# Patient Record
Sex: Female | Born: 1960 | Race: Black or African American | Hispanic: No | Smoking: Never smoker
Health system: Southern US, Community
[De-identification: ages and names within clinical notes are randomized; demographics above are authoritative.]

## PROBLEM LIST (undated history)

## (undated) ENCOUNTER — Emergency Department: Discharge status: Absent without leave | Payer: Medicare HMO

## (undated) DIAGNOSIS — I10 Essential (primary) hypertension: Secondary | ICD-10-CM

## (undated) DIAGNOSIS — F22 Delusional disorders: Secondary | ICD-10-CM

---

## 2004-04-26 ENCOUNTER — Ambulatory Visit: Payer: Self-pay | Admitting: Specialist

## 2005-08-04 ENCOUNTER — Ambulatory Visit: Payer: Self-pay | Admitting: Family Medicine

## 2006-04-19 ENCOUNTER — Ambulatory Visit: Payer: Self-pay | Admitting: Family Medicine

## 2006-05-10 ENCOUNTER — Ambulatory Visit: Payer: Self-pay | Admitting: Family Medicine

## 2006-08-07 ENCOUNTER — Ambulatory Visit: Payer: Self-pay | Admitting: Family Medicine

## 2007-01-22 ENCOUNTER — Emergency Department: Payer: Self-pay | Admitting: Emergency Medicine

## 2008-01-02 ENCOUNTER — Ambulatory Visit: Payer: Self-pay | Admitting: Family Medicine

## 2008-11-01 ENCOUNTER — Emergency Department: Payer: Self-pay | Admitting: Emergency Medicine

## 2009-01-07 ENCOUNTER — Ambulatory Visit: Payer: Self-pay | Admitting: Family Medicine

## 2009-05-19 ENCOUNTER — Emergency Department: Payer: Self-pay | Admitting: Emergency Medicine

## 2010-01-21 ENCOUNTER — Ambulatory Visit: Payer: Self-pay | Admitting: Family Medicine

## 2012-03-29 ENCOUNTER — Ambulatory Visit: Payer: Self-pay | Admitting: Family Medicine

## 2013-04-08 ENCOUNTER — Ambulatory Visit: Payer: Self-pay | Admitting: Family Medicine

## 2013-10-23 ENCOUNTER — Ambulatory Visit: Payer: Self-pay | Admitting: Family Medicine

## 2013-12-17 ENCOUNTER — Emergency Department: Payer: Self-pay | Admitting: Emergency Medicine

## 2014-03-14 ENCOUNTER — Ambulatory Visit: Payer: Self-pay | Admitting: Family Medicine

## 2014-03-31 DIAGNOSIS — M62838 Other muscle spasm: Secondary | ICD-10-CM | POA: Insufficient documentation

## 2014-06-24 ENCOUNTER — Ambulatory Visit: Payer: Self-pay | Admitting: Physical Medicine and Rehabilitation

## 2014-06-24 DIAGNOSIS — M47816 Spondylosis without myelopathy or radiculopathy, lumbar region: Secondary | ICD-10-CM | POA: Diagnosis not present

## 2014-07-03 DIAGNOSIS — M62838 Other muscle spasm: Secondary | ICD-10-CM | POA: Diagnosis not present

## 2014-07-03 DIAGNOSIS — M62830 Muscle spasm of back: Secondary | ICD-10-CM | POA: Diagnosis not present

## 2014-07-03 DIAGNOSIS — M5416 Radiculopathy, lumbar region: Secondary | ICD-10-CM | POA: Diagnosis not present

## 2014-07-31 DIAGNOSIS — I10 Essential (primary) hypertension: Secondary | ICD-10-CM | POA: Diagnosis not present

## 2014-07-31 DIAGNOSIS — J069 Acute upper respiratory infection, unspecified: Secondary | ICD-10-CM | POA: Diagnosis not present

## 2014-07-31 DIAGNOSIS — M791 Myalgia: Secondary | ICD-10-CM | POA: Diagnosis not present

## 2014-07-31 DIAGNOSIS — F411 Generalized anxiety disorder: Secondary | ICD-10-CM | POA: Diagnosis not present

## 2014-08-04 DIAGNOSIS — F29 Unspecified psychosis not due to a substance or known physiological condition: Secondary | ICD-10-CM | POA: Diagnosis not present

## 2015-05-28 ENCOUNTER — Other Ambulatory Visit: Payer: Self-pay | Admitting: Family Medicine

## 2015-05-28 DIAGNOSIS — R1013 Epigastric pain: Secondary | ICD-10-CM

## 2015-05-28 DIAGNOSIS — R110 Nausea: Secondary | ICD-10-CM

## 2015-05-29 ENCOUNTER — Ambulatory Visit
Admission: RE | Admit: 2015-05-29 | Discharge: 2015-05-29 | Disposition: A | Discharge status: Home / Self Care | Payer: Medicare HMO | Source: Ambulatory Visit | Attending: Family Medicine | Admitting: Family Medicine

## 2015-05-29 DIAGNOSIS — K8020 Calculus of gallbladder without cholecystitis without obstruction: Secondary | ICD-10-CM | POA: Insufficient documentation

## 2015-05-29 DIAGNOSIS — R110 Nausea: Secondary | ICD-10-CM

## 2015-05-29 DIAGNOSIS — R1013 Epigastric pain: Secondary | ICD-10-CM | POA: Diagnosis present

## 2016-09-23 ENCOUNTER — Emergency Department
Admission: EM | Admit: 2016-09-23 | Discharge: 2016-09-23 | Disposition: A | Discharge status: Home / Self Care | Payer: Medicare HMO | Attending: Emergency Medicine | Admitting: Emergency Medicine

## 2016-09-23 ENCOUNTER — Emergency Department: Payer: Medicare HMO

## 2016-09-23 ENCOUNTER — Encounter: Payer: Self-pay | Admitting: Emergency Medicine

## 2016-09-23 DIAGNOSIS — I10 Essential (primary) hypertension: Secondary | ICD-10-CM | POA: Diagnosis not present

## 2016-09-23 DIAGNOSIS — R4182 Altered mental status, unspecified: Secondary | ICD-10-CM | POA: Diagnosis not present

## 2016-09-23 HISTORY — DX: Essential (primary) hypertension: I10

## 2016-09-23 LAB — CBC WITH DIFFERENTIAL/PLATELET
BASOS ABS: 0 10*3/uL (ref 0–0.1)
BASOS PCT: 0 %
EOS PCT: 0 %
Eosinophils Absolute: 0 10*3/uL (ref 0–0.7)
HCT: 38.9 % (ref 35.0–47.0)
Hemoglobin: 13.1 g/dL (ref 12.0–16.0)
Lymphocytes Relative: 21 %
Lymphs Abs: 1.1 10*3/uL (ref 1.0–3.6)
MCH: 28.9 pg (ref 26.0–34.0)
MCHC: 33.6 g/dL (ref 32.0–36.0)
MCV: 86.1 fL (ref 80.0–100.0)
MONO ABS: 0.5 10*3/uL (ref 0.2–0.9)
Monocytes Relative: 10 %
Neutro Abs: 3.5 10*3/uL (ref 1.4–6.5)
Neutrophils Relative %: 69 %
PLATELETS: 261 10*3/uL (ref 150–440)
RBC: 4.52 MIL/uL (ref 3.80–5.20)
RDW: 14.5 % (ref 11.5–14.5)
WBC: 5 10*3/uL (ref 3.6–11.0)

## 2016-09-23 LAB — BASIC METABOLIC PANEL
Anion gap: 5 (ref 5–15)
BUN: 10 mg/dL (ref 6–20)
CHLORIDE: 108 mmol/L (ref 101–111)
CO2: 27 mmol/L (ref 22–32)
Calcium: 10.9 mg/dL — ABNORMAL HIGH (ref 8.9–10.3)
Creatinine, Ser: 1.12 mg/dL — ABNORMAL HIGH (ref 0.44–1.00)
GFR calc Af Amer: >60 mL/min (ref >60)
GFR calc non Af Amer: 54 mL/min — ABNORMAL LOW (ref >60)
GLUCOSE: 103 mg/dL — AB (ref 65–99)
POTASSIUM: 4 mmol/L (ref 3.5–5.1)
Sodium: 140 mmol/L (ref 135–145)

## 2016-09-23 LAB — CK: Total CK: 370 U/L — ABNORMAL HIGH (ref 38–234)

## 2016-09-23 LAB — URINALYSIS, COMPLETE (UACMP) WITH MICROSCOPIC
Bilirubin Urine: NEGATIVE
GLUCOSE, UA: NEGATIVE mg/dL
Hgb urine dipstick: NEGATIVE
Ketones, ur: 5 mg/dL — AB
NITRITE: NEGATIVE
PROTEIN: 100 mg/dL — AB
RBC / HPF: NONE SEEN RBC/hpf (ref 0–5)
Specific Gravity, Urine: 1.028 (ref 1.005–1.030)
pH: 5 (ref 5.0–8.0)

## 2016-09-23 LAB — HEPATIC FUNCTION PANEL
ALK PHOS: 113 U/L (ref 38–126)
ALT: 21 U/L (ref 14–54)
AST: 37 U/L (ref 15–41)
Albumin: 4.3 g/dL (ref 3.5–5.0)
BILIRUBIN DIRECT: 0.3 mg/dL (ref 0.1–0.5)
BILIRUBIN INDIRECT: 0.7 mg/dL (ref 0.3–0.9)
BILIRUBIN TOTAL: 1 mg/dL (ref 0.3–1.2)
Total Protein: 8.1 g/dL (ref 6.5–8.1)

## 2016-09-23 LAB — MAGNESIUM: MAGNESIUM: 1.8 mg/dL (ref 1.7–2.4)

## 2016-09-23 LAB — PHOSPHORUS: PHOSPHORUS: 1.9 mg/dL — AB (ref 2.5–4.6)

## 2016-09-23 LAB — HCG, QUANTITATIVE, PREGNANCY: hCG, Beta Chain, Quant, S: 2 m[IU]/mL (ref <5)

## 2016-09-23 LAB — ETHANOL: Alcohol, Ethyl (B): <5 mg/dL (ref <5)

## 2016-09-23 LAB — TSH: TSH: 1.375 u[IU]/mL (ref 0.350–4.500)

## 2016-09-23 LAB — T4, FREE: FREE T4: 0.89 ng/dL (ref 0.61–1.12)

## 2016-09-23 MED ORDER — SODIUM CHLORIDE 0.9 % IV BOLUS (SEPSIS)
1000.0000 mL | Freq: Once | INTRAVENOUS | Status: AC
  Administered 2016-09-23: 1000 mL via INTRAVENOUS

## 2016-09-23 NOTE — ED Notes (Addendum)
Patient transported to X-ray and CT 

## 2016-09-23 NOTE — Discharge Instructions (Signed)
Please return to the emergency department for any new or worsening symptoms such as chest pain, shortness of breath, headache, or for any other concerns. Otherwise follow up with your primary care physician on Monday for a recheck.  It was a pleasure to take care of you today, and thank you for coming to our emergency department.  If you have any questions or concerns before leaving please ask the nurse to grab me and I'm more than happy to go through your aftercare instructions again.  If you were prescribed any opioid pain medication today such as Norco, Vicodin, Percocet, morphine, hydrocodone, or oxycodone please make sure you do not drive when you are taking this medication as it can alter your ability to drive safely.  If you have any concerns once you are home that you are not improving or are in fact getting worse before you can make it to your follow-up appointment, please do not hesitate to call 911 and come back for further evaluation.  Merrily Brittle MD  Results for orders placed or performed during the hospital encounter of 09/23/16  Basic metabolic panel  Result Value Ref Range   Sodium 140 135 - 145 mmol/L   Potassium 4.0 3.5 - 5.1 mmol/L   Chloride 108 101 - 111 mmol/L   CO2 27 22 - 32 mmol/L   Glucose, Bld 103 (H) 65 - 99 mg/dL   BUN 10 6 - 20 mg/dL   Creatinine, Ser 4.09 (H) 0.44 - 1.00 mg/dL   Calcium 81.1 (H) 8.9 - 10.3 mg/dL   GFR calc non Af Amer 54 (L) >60 mL/min   GFR calc Af Amer >60 >60 mL/min   Anion gap 5 5 - 15  Hepatic function panel  Result Value Ref Range   Total Protein 8.1 6.5 - 8.1 g/dL   Albumin 4.3 3.5 - 5.0 g/dL   AST 37 15 - 41 U/L   ALT 21 14 - 54 U/L   Alkaline Phosphatase 113 38 - 126 U/L   Total Bilirubin 1.0 0.3 - 1.2 mg/dL   Bilirubin, Direct 0.3 0.1 - 0.5 mg/dL   Indirect Bilirubin 0.7 0.3 - 0.9 mg/dL  CBC with Differential  Result Value Ref Range   WBC 5.0 3.6 - 11.0 K/uL   RBC 4.52 3.80 - 5.20 MIL/uL   Hemoglobin 13.1 12.0 - 16.0  g/dL   HCT 91.4 78.2 - 95.6 %   MCV 86.1 80.0 - 100.0 fL   MCH 28.9 26.0 - 34.0 pg   MCHC 33.6 32.0 - 36.0 g/dL   RDW 21.3 08.6 - 57.8 %   Platelets 261 150 - 440 K/uL   Neutrophils Relative % 69 %   Neutro Abs 3.5 1.4 - 6.5 K/uL   Lymphocytes Relative 21 %   Lymphs Abs 1.1 1.0 - 3.6 K/uL   Monocytes Relative 10 %   Monocytes Absolute 0.5 0.2 - 0.9 K/uL   Eosinophils Relative 0 %   Eosinophils Absolute 0.0 0 - 0.7 K/uL   Basophils Relative 0 %   Basophils Absolute 0.0 0 - 0.1 K/uL  Ethanol  Result Value Ref Range   Alcohol, Ethyl (B) <5 <5 mg/dL  Urinalysis, Complete w Microscopic  Result Value Ref Range   Color, Urine AMBER (A) YELLOW   APPearance HAZY (A) CLEAR   Specific Gravity, Urine 1.028 1.005 - 1.030   pH 5.0 5.0 - 8.0   Glucose, UA NEGATIVE NEGATIVE mg/dL   Hgb urine dipstick NEGATIVE NEGATIVE  Bilirubin Urine NEGATIVE NEGATIVE   Ketones, ur 5 (A) NEGATIVE mg/dL   Protein, ur 161 (A) NEGATIVE mg/dL   Nitrite NEGATIVE NEGATIVE   Leukocytes, UA TRACE (A) NEGATIVE   RBC / HPF NONE SEEN 0 - 5 RBC/hpf   WBC, UA 0-5 0 - 5 WBC/hpf   Bacteria, UA RARE (A) NONE SEEN   Squamous Epithelial / LPF 0-5 (A) NONE SEEN   Mucous PRESENT    Ca Oxalate Crys, UA PRESENT   TSH  Result Value Ref Range   TSH 1.375 0.350 - 4.500 uIU/mL  T4, free  Result Value Ref Range   Free T4 0.89 0.61 - 1.12 ng/dL  Magnesium  Result Value Ref Range   Magnesium 1.8 1.7 - 2.4 mg/dL  Phosphorus  Result Value Ref Range   Phosphorus 1.9 (L) 2.5 - 4.6 mg/dL  CK  Result Value Ref Range   Total CK 370 (H) 38 - 234 U/L  hCG, quantitative, pregnancy  Result Value Ref Range   hCG, Beta Chain, Quant, S 2 <5 mIU/mL   Dg Chest 2 View  Result Date: 09/23/2016 CLINICAL DATA:  Hypercalcemia. EXAM: CHEST  2 VIEW COMPARISON:  None. FINDINGS: The lungs are clear. Heart size is normal. No pneumothorax or pleural effusion. No acute bony abnormality. IMPRESSION: No acute disease. Electronically Signed   By:  Drusilla Kanner M.D.   On: 09/23/2016 16:05   Ct Head Wo Contrast  Result Date: 09/23/2016 CLINICAL DATA:  Confusion EXAM: CT HEAD WITHOUT CONTRAST TECHNIQUE: Contiguous axial images were obtained from the base of the skull through the vertex without intravenous contrast. COMPARISON:  12/17/2013 FINDINGS: Brain: No evidence of acute infarction, hemorrhage, hydrocephalus, extra-axial collection or mass lesion/mass effect. Vascular: No hyperdense vessel or unexpected calcification. Skull: Normal. Negative for fracture or focal lesion. Sinuses/Orbits: No acute finding. Other: None. IMPRESSION: No acute intracranial abnormality noted. Electronically Signed   By: Alcide Clever M.D.   On: 09/23/2016 16:08

## 2016-09-23 NOTE — ED Notes (Signed)
MD at bedside to update pt. Pt in NAD and able to ambulate without distress.
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
## 2016-09-23 NOTE — ED Notes (Signed)
Psychology at bedside.
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
## 2016-09-24 LAB — PTH, INTACT AND CALCIUM
Calcium, Total (PTH): 11.2 mg/dL — ABNORMAL HIGH (ref 8.7–10.2)
PTH: 51 pg/mL (ref 15–65)

## 2016-12-27 IMAGING — MR MRI LUMBAR SPINE WITHOUT CONTRAST
4 of 5 series · 25 of 48 positions shown · non-contrast
Comparison: Radiographs dated 12/17/2013

CLINICAL DATA: Low back pain and left leg pain.

EXAM:
MRI LUMBAR SPINE WITHOUT CONTRAST
TECHNIQUE: Multiplanar, multisequence MR imaging of the lumbar spine was
performed. No intravenous contrast was administered.

[Series 2: T2 · sagittal · 4.0mm · 0.81mm/px · 6 of 15 slices shown (1 of 2)]
[im 1/15]
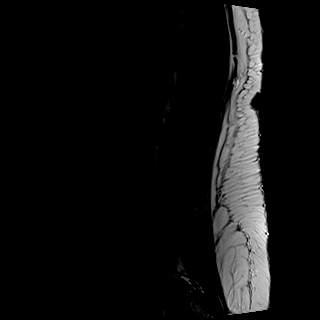
[im 3/15]
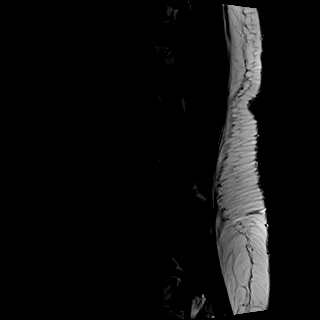
[im 6/15]
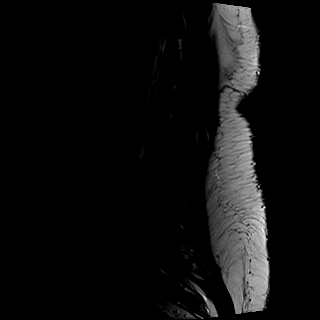
[im 9/15]
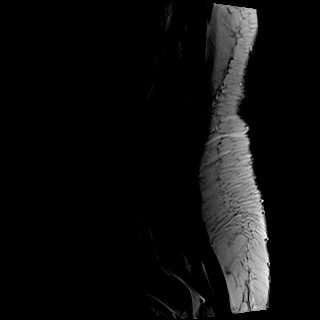
[im 12/15]
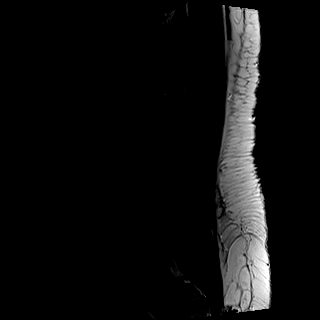
[im 15/15]
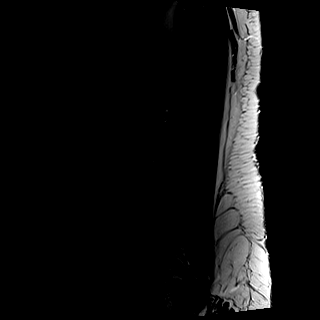

[Series 3: T1 · sagittal · 4.0mm · 0.41mm/px · 6 of 15 slices shown (1 of 2)]
[im 1/15]
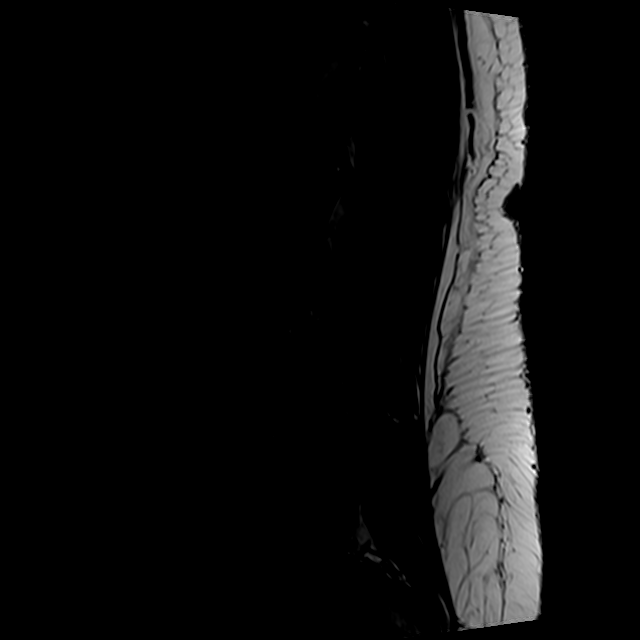
[im 3/15]
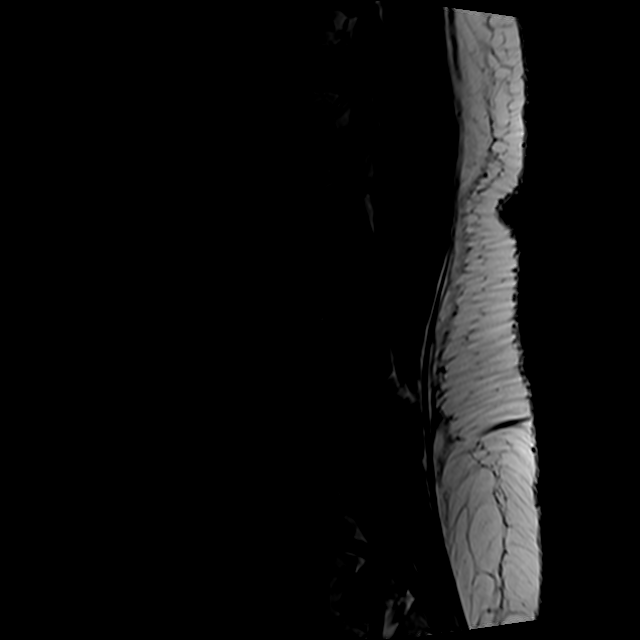
[im 6/15]
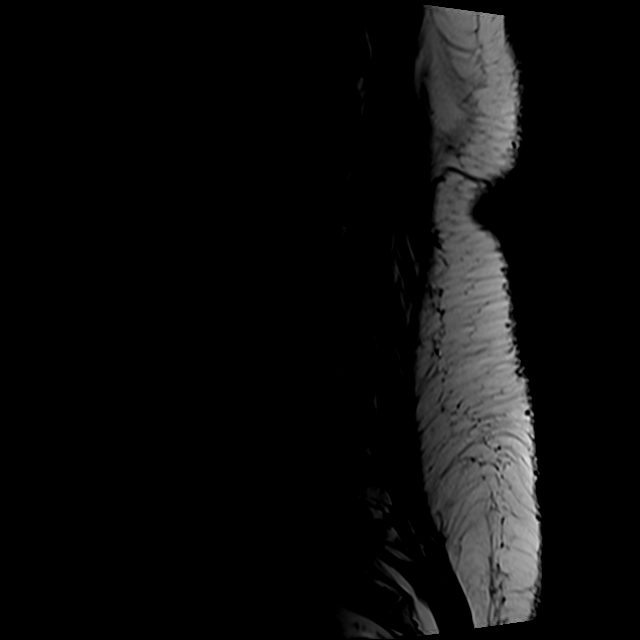
[im 9/15]
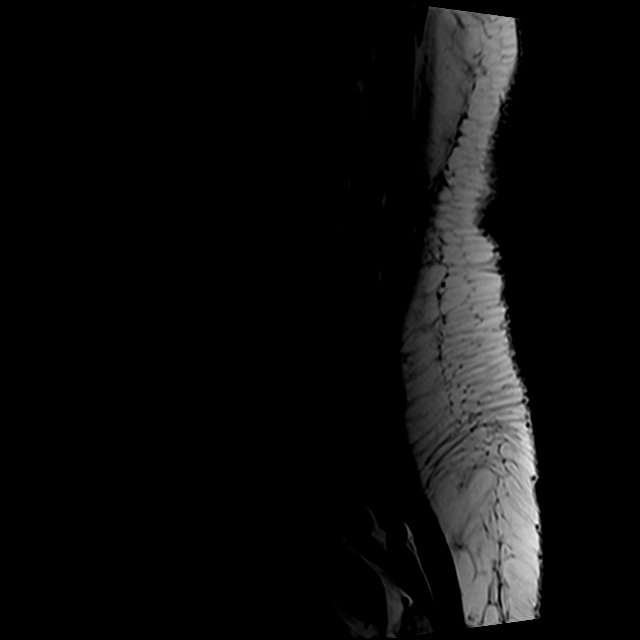
[im 12/15]
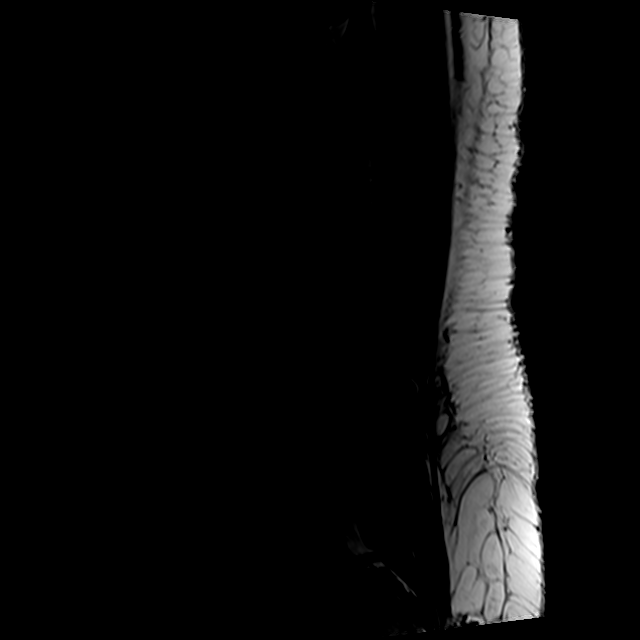
[im 15/15]
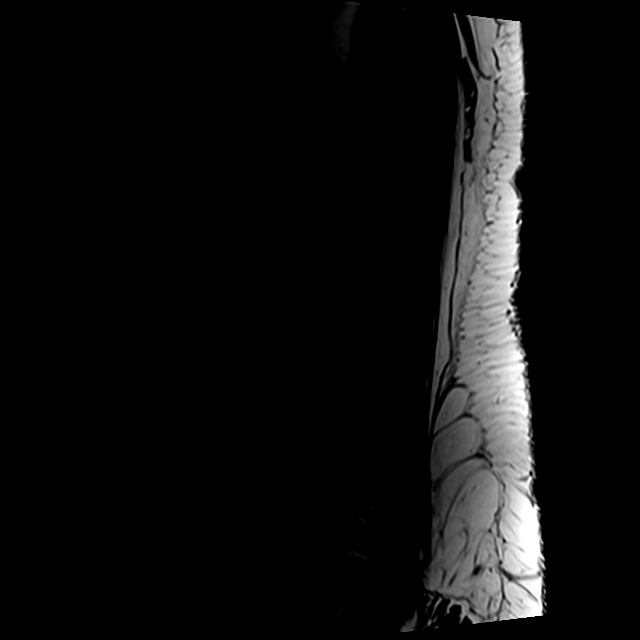

[Series 5: T2 · axial · 4.0mm · 0.78mm/px · z∈[-59,+174]mm · 9 of 41 slices shown (2 of 2)]
[im 1/41]
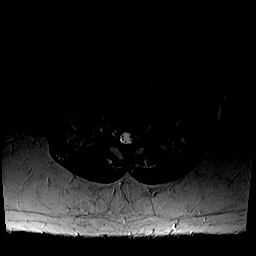
[im 6/41]
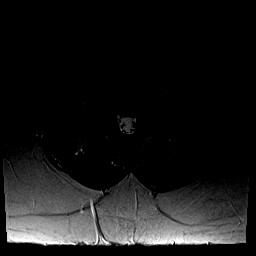
[im 12/41]
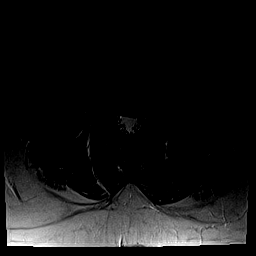
[im 18/41]
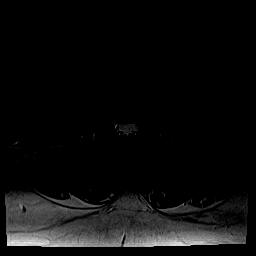
[im 21/41]
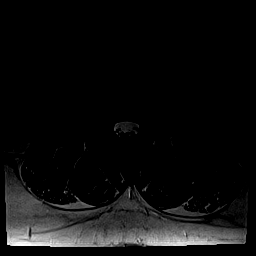
[im 23/41]
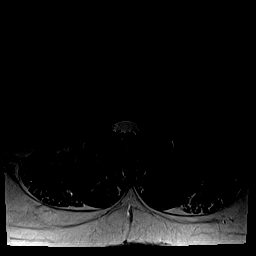
[im 29/41]
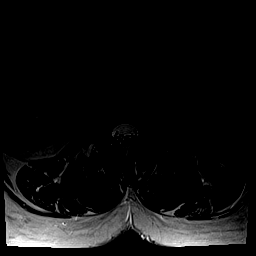
[im 35/41]
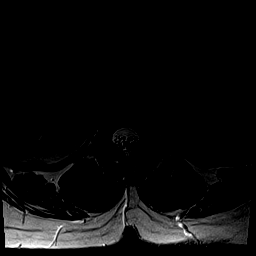
[im 41/41]
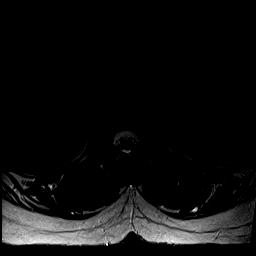

[Series 6: T1 · axial · 4.0mm · 0.31mm/px · z∈[-59,+144]mm · 4 of 41 slices shown (2 of 2)]
[im 1/41]
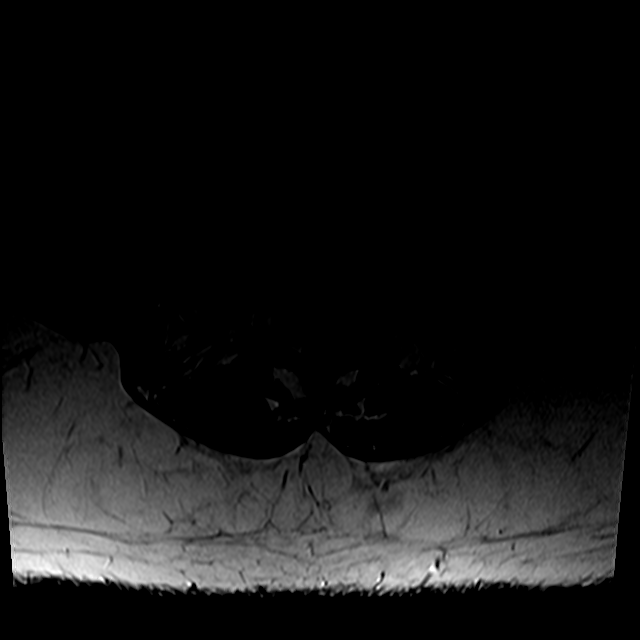
[im 6/41]
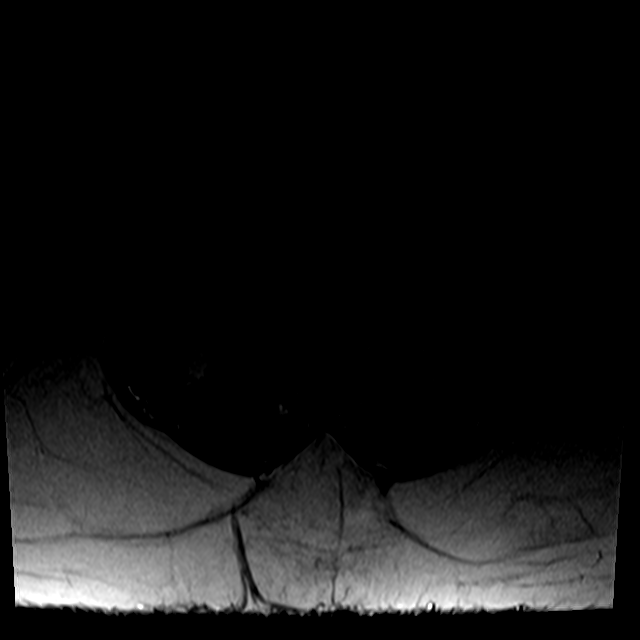
[im 21/41]
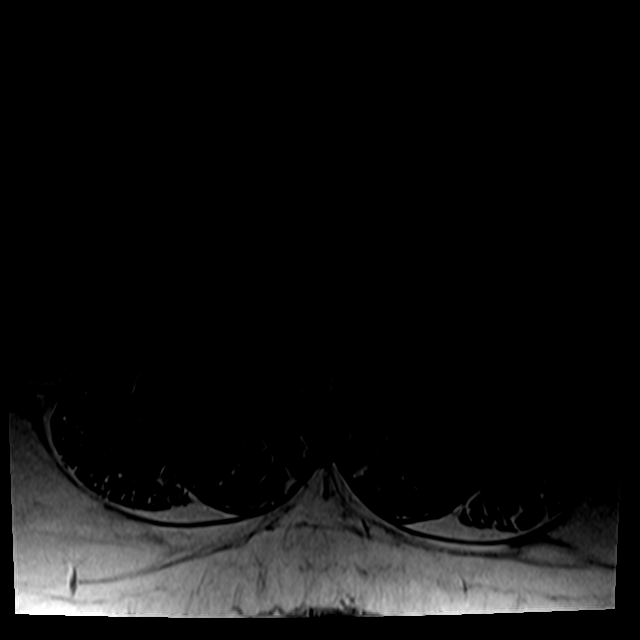
[im 35/41]
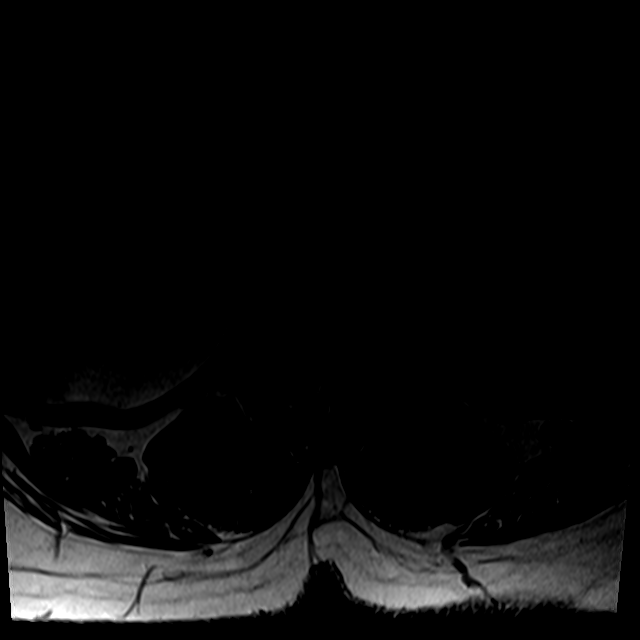

[25 of 48 positions shown; findings below may reference images not displayed]

FINDINGS: Tip of the conus is at L1-2.  Normal paraspinal soft tissues.

T11-12 through L2-3:  Normal.

L3-4:  Small broad-based disc bulge with no neural impingement.

L4-5: Small broad-based disc bulge with central annular fissure with
no neural impingement. Minimal degenerative changes of the right
facet joint.

L5-S1: Normal disc. Slight degenerative changes of the facet joints.
IMPRESSION: Minimal degenerative disc disease at L4-5 and L3-4.

Minimal facet arthritis at L4-5 and L5-S1.

## 2017-08-18 ENCOUNTER — Other Ambulatory Visit: Payer: Self-pay | Admitting: Family Medicine

## 2017-08-18 DIAGNOSIS — R928 Other abnormal and inconclusive findings on diagnostic imaging of breast: Secondary | ICD-10-CM

## 2017-09-09 ENCOUNTER — Emergency Department
Admission: EM | Admit: 2017-09-09 | Discharge: 2017-09-09 | Disposition: A | Discharge status: Home / Self Care | Payer: Medicare HMO | Attending: Emergency Medicine | Admitting: Emergency Medicine

## 2017-09-09 ENCOUNTER — Other Ambulatory Visit: Payer: Self-pay

## 2017-09-09 ENCOUNTER — Encounter: Payer: Self-pay | Admitting: Emergency Medicine

## 2017-09-09 DIAGNOSIS — G609 Hereditary and idiopathic neuropathy, unspecified: Secondary | ICD-10-CM | POA: Diagnosis not present

## 2017-09-09 DIAGNOSIS — I10 Essential (primary) hypertension: Secondary | ICD-10-CM | POA: Diagnosis not present

## 2017-09-09 DIAGNOSIS — M79671 Pain in right foot: Secondary | ICD-10-CM | POA: Diagnosis not present

## 2017-09-09 DIAGNOSIS — M79672 Pain in left foot: Secondary | ICD-10-CM | POA: Diagnosis not present

## 2017-09-09 MED ORDER — MELOXICAM 7.5 MG PO TABS: 7.5000 mg | ORAL_TABLET | Freq: Every day | ORAL | 0 refills | Status: AC

## 2017-09-09 MED ORDER — MELOXICAM 15 MG PO TABS: 15.0000 mg | ORAL_TABLET | Freq: Every day | ORAL | 0 refills | Status: DC

## 2017-09-09 MED ORDER — GABAPENTIN 300 MG PO CAPS: 300.0000 mg | ORAL_CAPSULE | Freq: Every day | ORAL | 0 refills | Status: DC

## 2017-09-09 NOTE — ED Triage Notes (Addendum)
Pt states when she wakes up in the morning her feet are swollen, states it is difficult to walk. Pt states throughout the day she can walk, but states her feet feel "dead"  Pt states the feeling in her feet are like pins and needles.  Pt states she is not a diabetic.  Pt states sxs have been present for about a week.  Pt has some swelling noted around the ankles.  No lower leg edema present.  Pt takes lisinopril for HTN.

## 2017-09-09 NOTE — Discharge Instructions (Addendum)
Please take gabapentin at nighttime to help with burning in both feet.  Take meloxicam daily with food as needed for pain.  Follow-up with podiatrist in 1 week if no improvement.

## 2017-09-09 NOTE — ED Provider Notes (Signed)
[facility] EMERGENCY DEPARTMENT Provider Note   CSN: [id number] Arrival date & time: 09/09/17  1758     History   Chief Complaint Chief Complaint  Patient presents with  . Foot Pain  . Foot Swelling    HPI Erin Orr is a 57 y.o. female presents to the emergency department for evaluation of bilateral feet pain.  She states her last 2 weeks she has had numbness in her feet at nighttime.  Numbness is located along the medial and dorsal aspect of both feet.  She denies any trauma or injury.  No warmth swelling or redness.  Symptoms are increased at nighttime and in the morning but improves as the day goes on.  Her pain was 10 out of 10 this morning upon awakening but currently is 8 out of 10.  She has not been taking medications for pain.  She denies any back pain, radicular symptoms.  No saddle anesthesia no loss of bowel or bladder symptoms.  HPI  Past Medical History:  Diagnosis Date  . Hypertension     There are no active problems to display for this patient.   History reviewed. No pertinent surgical history.   OB History   None      Home Medications    Prior to Admission medications   Medication Sig Start Date End Date Taking? Authorizing Provider  gabapentin (NEURONTIN) 300 MG capsule Take 1 capsule (300 mg total) by mouth at bedtime for 20 days. 09/09/17 09/29/17  Evon SlackGaines, Lexi Conaty C, PA-C  meloxicam (MOBIC) 15 MG tablet Take 1 tablet (15 mg total) by mouth daily. 09/09/17 09/09/18  Evon SlackGaines, Leyah Bocchino C, PA-C    Family History No family history on file.  Social History Social History   Tobacco Use  . Smoking status: Never Smoker  . Smokeless tobacco: Never Used  Substance Use Topics  . Alcohol use: Not on file  . Drug use: Not on file     Allergies   Patient has no known allergies.   Review of Systems Review of Systems  Constitutional: Negative for fever.  Respiratory: Negative for shortness of breath.   Cardiovascular:  Negative for chest pain.  Gastrointestinal: Negative for abdominal pain.  Genitourinary: Negative for difficulty urinating, dysuria and urgency.  Musculoskeletal: Positive for arthralgias. Negative for back pain and myalgias.  Skin: Negative for rash.  Neurological: Positive for numbness. Negative for dizziness, weakness and headaches.     Physical Exam Updated Vital Signs BP 128/76 (BP Location: Left Arm)   Pulse 73   Temp 98.2 F (36.8 C) (Oral)   Resp 18   Ht 5' 6.5" (1.689 m)   Wt 90.7 kg (200 lb)   SpO2 99%   BMI 31.80 kg/m   Physical Exam  Constitutional: She is oriented to person, place, and time. She appears well-developed and well-nourished.  HENT:  Head: Normocephalic and atraumatic.  Eyes: Conjunctivae are normal.  Neck: Normal range of motion.  Cardiovascular: Normal rate.  Pulmonary/Chest: Effort normal. No respiratory distress.  Musculoskeletal:  Examination of the lower extremities show no swelling warmth erythema or edema.  She has 2+ dorsalis pedis pulses bilaterally.  She is mildly tender along the medial and dorsum of both feet with normal sensation bilaterally, no sensation loss noted.  She has good ankle plantarflexion, dorsiflexion bilaterally.  Negative Homans sign bilaterally.  Neurological: She is alert and oriented to person, place, and time.  Skin: Skin is warm. No rash noted.  Psychiatric: She has a  normal mood and affect. Her behavior is normal. Thought content normal.  Nursing note and vitals reviewed.    ED Treatments / Results  Labs (all labs ordered are listed, but only abnormal results are displayed) Labs Reviewed - No data to display  EKG None  Radiology No results found.  Procedures Procedures (including critical care time)  Medications Ordered in ED Medications - No data to display   Initial Impression / Assessment and Plan / ED Course  I have reviewed the triage vital signs and the nursing notes.  Pertinent labs &  imaging results that were available during my care of the patient were reviewed by me and considered in my medical decision making (see chart for details).     57 year old female with 2 weeks of bilateral foot pain, numbness and tingling at nighttime.  No history of diabetes, she has flat-footed.  She has not been taking any medications for her symptoms.  Examination of both feet show no signs of gout, infection, blood clots.  Signs and symptoms consistent with neuropathic pain.  Will start gabapentin and meloxicam.  We will do a short course of meloxicam 7.5 mg daily for 7 days.  She will follow-up with podiatrist.  She is educated on signs and symptoms return to ED for.  Final Clinical Impressions(s) / ED Diagnoses   Final diagnoses:  Foot pain, right  Foot pain, left  Peripheral neuropathy, idiopathic    ED Discharge Orders        Ordered    gabapentin (NEURONTIN) 300 MG capsule  Daily at bedtime     09/09/17 1920    meloxicam (MOBIC) 15 MG tablet  Daily     09/09/17 1920       Ronnette Juniper 09/09/17 1930    Don Perking, [place], MD 09/10/17 [phone]

## 2017-09-09 NOTE — ED Notes (Signed)
Pt able to ambulate independently. NAD distress noted. Pt has noted limp.

## 2018-04-17 ENCOUNTER — Ambulatory Visit
Admission: RE | Admit: 2018-04-17 | Discharge: 2018-04-17 | Disposition: A | Discharge status: Home / Self Care | Payer: Medicare HMO | Source: Ambulatory Visit | Attending: Family Medicine | Admitting: Family Medicine

## 2018-04-17 DIAGNOSIS — R928 Other abnormal and inconclusive findings on diagnostic imaging of breast: Secondary | ICD-10-CM | POA: Diagnosis not present

## 2019-08-05 ENCOUNTER — Encounter: Payer: Self-pay | Admitting: Family

## 2019-08-05 ENCOUNTER — Ambulatory Visit (INDEPENDENT_AMBULATORY_CARE_PROVIDER_SITE_OTHER): Payer: Medicare HMO | Admitting: Family

## 2019-08-05 ENCOUNTER — Other Ambulatory Visit: Payer: Self-pay

## 2019-08-05 VITALS — Ht 66.0 in | Wt 200.0 lb

## 2019-08-05 DIAGNOSIS — Z0000 Encounter for general adult medical examination without abnormal findings: Secondary | ICD-10-CM | POA: Insufficient documentation

## 2019-08-05 DIAGNOSIS — I10 Essential (primary) hypertension: Secondary | ICD-10-CM | POA: Diagnosis not present

## 2019-08-05 DIAGNOSIS — Z7689 Persons encountering health services in other specified circumstances: Secondary | ICD-10-CM

## 2019-08-05 NOTE — Progress Notes (Signed)
Virtual Visit via Video Note  I connected with@  on 08/05/19 at  3:00 PM EST by a video enabled telemedicine application and verified that I am speaking with the correct person using two identifiers.  Location patient: home Location provider:work  Persons participating in the virtual visit: patient, provider  I discussed the limitations of evaluation and management by telemedicine and the availability of in person appointments. The patient expressed understanding and agreed to proceed.   HPI: Establish care, wanted to have to provider close by 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 Feels well today. No complaints  HTN- hasnt been checked blood pressure at home. Taking lisinopril, metoprolol.  Denies exertional chest pain or pressure, numbness or tingling radiating to left arm or jaw, palpitations, dizziness, frequent headaches, changes in vision, or shortness of breath.  Walks outside for 3-4 times per week, some yardwork. Has lost weight overall, today 200 lbs.  Following low salt.  Coping well during pandemic.   Due for MM, pap,colonoscopy Declines colonoscopy at this time.   ROS: See pertinent positives and negatives per HPI.  Past Medical History:  Diagnosis Date  . Hypertension     History reviewed. No pertinent surgical history.  Family History  Problem Relation Age of Onset  . Heart disease Brother        has 3 brothers  . Heart attack Brother   . Colon cancer Neg Hx   . Breast cancer Neg Hx     SOCIAL HX: never smoker   Current Outpatient Medications:  .  lisinopril (ZESTRIL) 20 MG tablet, Take 1 tablet by mouth daily., Disp: , Rfl:  .  metoprolol succinate (TOPROL-XL) 100 MG 24 hr tablet, Take 1 tablet by mouth daily., Disp: , Rfl:   EXAM:  VITALS per patient if applicable:  GENERAL: alert, oriented, appears well and in no acute distress  HEENT: atraumatic, conjunttiva clear, no obvious abnormalities on inspection of external nose and ears  NECK: normal movements of  the head and neck  LUNGS: on inspection no signs of respiratory distress, breathing rate appears normal, no obvious gross SOB, gasping or wheezing  CV: no obvious cyanosis  MS: moves all visible extremities without noticeable abnormality  PSYCH/NEURO: pleasant and cooperative, no obvious depression or anxiety, speech and thought processing grossly intact  ASSESSMENT AND PLAN:  Discussed the following assessment and plan:  Encounter to establish care - Plan: CBC with Differential/Platelet, Comprehensive metabolic panel, Hemoglobin A1c, Lipid panel, TSH, VITAMIN D 25 Hydroxy (Vit-D Deficiency, Fractures), MM 3D SCREEN BREAST BILATERAL  Essential hypertension Problem List Items Addressed This Visit      Cardiovascular and Mediastinum   HTN (hypertension)    BP Readings from Last 3 Encounters:  09/09/17 (!) 152/87  09/23/16 (!) 177/87   I do not have any recent blood pressure values.  Advised patient to spot check we discussed goal of less than 120/80.  Patient will call me if she is not within goal.  She also will come for in person visit for follow-up, CPE.      Relevant Medications   metoprolol succinate (TOPROL-XL) 100 MG 24 hr tablet   lisinopril (ZESTRIL) 20 MG tablet     Other   Encounter to establish care - Primary    Reviewed past medical history.  Pending CPE.  Patient will schedule her own mammogram.  She declines colonoscopy at this time.      Relevant Orders   CBC with Differential/Platelet   Comprehensive metabolic panel   Hemoglobin A1c  Lipid panel   TSH   VITAMIN D 25 Hydroxy (Vit-D Deficiency, Fractures)   MM 3D SCREEN BREAST BILATERAL      -we discussed possible serious and likely etiologies, options for evaluation and workup, limitations of telemedicine visit vs in person visit, treatment, treatment risks and precautions. Pt prefers to treat via telemedicine empirically rather then risking or undertaking an in person visit at this moment. Patient  agrees to seek prompt in person care if worsening, new symptoms arise, or if is not improving with treatment.   I discussed the assessment and treatment plan with the patient. The patient was provided an opportunity to ask questions and all were answered. The patient agreed with the plan and demonstrated an understanding of the instructions.   The patient was advised to call back or seek an in-person evaluation if the symptoms worsen or if the condition fails to improve as anticipated.   Mable Paris, FNP

## 2019-08-05 NOTE — Assessment & Plan Note (Signed)
Reviewed past medical history.  Pending CPE.  Patient will schedule her own mammogram.  She declines colonoscopy at this time.

## 2019-08-05 NOTE — Patient Instructions (Signed)
Please call call and schedule your 3D mammogram, bone density scan as discussed.   [facility]  [address]  [place], [place]  [phone]  

## 2019-08-05 NOTE — Assessment & Plan Note (Signed)
BP Readings from Last 3 Encounters:  09/09/17 (!) 152/87  09/23/16 (!) 177/87   I do not have any recent blood pressure values.  Advised patient to spot check we discussed goal of less than 120/80.  Patient will call me if she is not within goal.  She also will come for in person visit for follow-up, CPE.

## 2019-08-07 ENCOUNTER — Telehealth: Payer: Self-pay | Admitting: Family

## 2019-08-07 NOTE — Telephone Encounter (Signed)
Return in about 3 months (around 11/02/2019) for Complete Physical Exam, Follow Up Chronic Management  Fasting labs ahead of CPE

## 2019-09-10 ENCOUNTER — Ambulatory Visit (INDEPENDENT_AMBULATORY_CARE_PROVIDER_SITE_OTHER): Payer: Medicare HMO | Admitting: Family

## 2019-09-10 ENCOUNTER — Encounter: Payer: Self-pay | Admitting: Family

## 2019-09-10 ENCOUNTER — Other Ambulatory Visit (HOSPITAL_COMMUNITY)
Admission: RE | Admit: 2019-09-10 | Discharge: 2019-09-10 | Disposition: A | Discharge status: Home / Self Care | Payer: Medicare HMO | Source: Ambulatory Visit | Attending: Family | Admitting: Family

## 2019-09-10 ENCOUNTER — Other Ambulatory Visit: Payer: Self-pay

## 2019-09-10 VITALS — BP 142/92 | HR 77 | Temp 97.7°F | Ht 67.0 in | Wt 200.2 lb

## 2019-09-10 DIAGNOSIS — I10 Essential (primary) hypertension: Secondary | ICD-10-CM | POA: Diagnosis not present

## 2019-09-10 DIAGNOSIS — Z01419 Encounter for gynecological examination (general) (routine) without abnormal findings: Secondary | ICD-10-CM | POA: Insufficient documentation

## 2019-09-10 DIAGNOSIS — Z7689 Persons encountering health services in other specified circumstances: Secondary | ICD-10-CM

## 2019-09-10 DIAGNOSIS — Z1151 Encounter for screening for human papillomavirus (HPV): Secondary | ICD-10-CM | POA: Insufficient documentation

## 2019-09-10 DIAGNOSIS — Z0000 Encounter for general adult medical examination without abnormal findings: Secondary | ICD-10-CM

## 2019-09-10 DIAGNOSIS — Z780 Asymptomatic menopausal state: Secondary | ICD-10-CM | POA: Insufficient documentation

## 2019-09-10 LAB — CBC WITH DIFFERENTIAL/PLATELET
Basophils Absolute: 0 10*3/uL (ref 0.0–0.1)
Basophils Relative: 0.1 % (ref 0.0–3.0)
Eosinophils Absolute: 0 10*3/uL (ref 0.0–0.7)
Eosinophils Relative: 0 % (ref 0.0–5.0)
HCT: 43.2 % (ref 36.0–46.0)
Hemoglobin: 14.2 g/dL (ref 12.0–15.0)
Lymphocytes Relative: 32.9 % (ref 12.0–46.0)
Lymphs Abs: 1.4 10*3/uL (ref 0.7–4.0)
MCHC: 32.8 g/dL (ref 30.0–36.0)
MCV: 87 fl (ref 78.0–100.0)
Monocytes Absolute: 0.3 10*3/uL (ref 0.1–1.0)
Monocytes Relative: 8.1 % (ref 3.0–12.0)
Neutro Abs: 2.4 10*3/uL (ref 1.4–7.7)
Neutrophils Relative %: 58.9 % (ref 43.0–77.0)
Platelets: 241 10*3/uL (ref 150.0–400.0)
RBC: 4.97 Mil/uL (ref 3.87–5.11)
RDW: 14.1 % (ref 11.5–15.5)
WBC: 4.1 10*3/uL (ref 4.0–10.5)

## 2019-09-10 LAB — COMPREHENSIVE METABOLIC PANEL
ALT: 9 U/L (ref 0–35)
AST: 12 U/L (ref 0–37)
Albumin: 4.3 g/dL (ref 3.5–5.2)
Alkaline Phosphatase: 138 U/L — ABNORMAL HIGH (ref 39–117)
BUN: 10 mg/dL (ref 6–23)
CO2: 29 mEq/L (ref 19–32)
Calcium: 11 mg/dL — ABNORMAL HIGH (ref 8.4–10.5)
Chloride: 103 mEq/L (ref 96–112)
Creatinine, Ser: 0.78 mg/dL (ref 0.40–1.20)
GFR: 91.27 mL/min (ref >60.00)
Glucose, Bld: 84 mg/dL (ref 70–99)
Potassium: 4.2 mEq/L (ref 3.5–5.1)
Sodium: 137 mEq/L (ref 135–145)
Total Bilirubin: 0.7 mg/dL (ref 0.2–1.2)
Total Protein: 7.6 g/dL (ref 6.0–8.3)

## 2019-09-10 LAB — TSH: TSH: 1.06 u[IU]/mL (ref 0.35–4.50)

## 2019-09-10 LAB — LIPID PANEL
Cholesterol: 168 mg/dL (ref 0–200)
HDL: 47.7 mg/dL (ref >39.00)
LDL Cholesterol: 100 mg/dL — ABNORMAL HIGH (ref 0–99)
NonHDL: 119.85
Total CHOL/HDL Ratio: 4
Triglycerides: 100 mg/dL (ref 0.0–149.0)
VLDL: 20 mg/dL (ref 0.0–40.0)

## 2019-09-10 LAB — HEMOGLOBIN A1C: Hgb A1c MFr Bld: 5.7 % (ref 4.6–6.5)

## 2019-09-10 LAB — VITAMIN D 25 HYDROXY (VIT D DEFICIENCY, FRACTURES): VITD: 15.36 ng/mL — ABNORMAL LOW (ref 30.00–100.00)

## 2019-09-10 MED ORDER — AMLODIPINE BESYLATE 5 MG PO TABS: 5.0000 mg | ORAL_TABLET | Freq: Every day | ORAL | 3 refills | Status: DC

## 2019-09-10 NOTE — Patient Instructions (Addendum)
STOP lisinopril.   Start amlodipine  Monitor blood pressure,  Goal is less than 120/80, based on newest guidelines; if persistently higher, please make sooner follow up appointment so we can recheck you blood pressure and manage medications  Please call call and schedule your 3D mammogram as discussed.   [facility]  [address]  [place], [place]  [phone]  Call me when ready for colonoscopy and Ill place the referral.   Moderna vaccine as discussed - scheduled today   Health Maintenance, Female Adopting a healthy lifestyle and getting preventive care are important in promoting health and wellness. Ask your health care provider about:  The right schedule for you to have regular tests and exams.  Things you can do on your own to prevent diseases and keep yourself healthy. What should I know about diet, weight, and exercise? Eat a healthy diet   Eat a diet that includes plenty of vegetables, fruits, low-fat dairy products, and lean protein.  Do not eat a lot of foods that are high in solid fats, added sugars, or sodium. Maintain a healthy weight Body mass index (BMI) is used to identify weight problems. It estimates body fat based on height and weight. Your health care provider can help determine your BMI and help you achieve or maintain a healthy weight. Get regular exercise Get regular exercise. This is one of the most important things you can do for your health. Most adults should:  Exercise for at least 150 minutes each week. The exercise should increase your heart rate and make you sweat (moderate-intensity exercise).  Do strengthening exercises at least twice a week. This is in addition to the moderate-intensity exercise.  Spend less time sitting. Even light physical activity can be beneficial. Watch cholesterol and blood lipids Have your blood tested for lipids and cholesterol at 60 years of age, then have this test every 5 years. Have  your cholesterol levels checked more often if:  Your lipid or cholesterol levels are high.  You are older than 60 years of age.  You are at high risk for heart disease. What should I know about cancer screening? Depending on your health history and family history, you may need to have cancer screening at various ages. This may include screening for:  Breast cancer.  Cervical cancer.  Colorectal cancer.  Skin cancer.  Lung cancer. What should I know about heart disease, diabetes, and high blood pressure? Blood pressure and heart disease  High blood pressure causes heart disease and increases the risk of stroke. This is more likely to develop in people who have high blood pressure readings, are of African descent, or are overweight.  Have your blood pressure checked: ? Every 3-5 years if you are 33-28 years of age. ? Every year if you are 67 years old or older. Diabetes Have regular diabetes screenings. This checks your fasting blood sugar level. Have the screening done:  Once every three years after age 63 if you are at a normal weight and have a low risk for diabetes.  More often and at a younger age if you are overweight or have a high risk for diabetes. What should I know about preventing infection? Hepatitis B If you have a higher risk for hepatitis B, you should be screened for this virus. Talk with your health care provider to find out if you are at risk for hepatitis B infection. Hepatitis C Testing is recommended for:  Everyone born from [birth year] through 1965.  Anyone with known risk factors for hepatitis C. Sexually transmitted infections (STIs)  Get screened for STIs, including gonorrhea and chlamydia, if: ? You are sexually active and are younger than 60 years of age. ? You are older than 60 years of age and your health care provider tells you that you are at risk for this type of infection. ? Your sexual activity has changed since you were last screened, and you  are at increased risk for chlamydia or gonorrhea. Ask your health care provider if you are at risk.  Ask your health care provider about whether you are at high risk for HIV. Your health care provider may recommend a prescription medicine to help prevent HIV infection. If you choose to take medicine to prevent HIV, you should first get tested for HIV. You should then be tested every 3 months for as Poliquin as you are taking the medicine. Pregnancy  If you are about to stop having your period (premenopausal) and you may become pregnant, seek counseling before you get pregnant.  Take 400 to 800 micrograms (mcg) of folic acid every day if you become pregnant.  Ask for birth control (contraception) if you want to prevent pregnancy. Osteoporosis and menopause Osteoporosis is a disease in which the bones lose minerals and strength with aging. This can result in bone fractures. If you are 40 years old or older, or if you are at risk for osteoporosis and fractures, ask your health care provider if you should:  Be screened for bone loss.  Take a calcium or vitamin D supplement to lower your risk of fractures.  Be given hormone replacement therapy (HRT) to treat symptoms of menopause. Follow these instructions at home: Lifestyle  Do not use any products that contain nicotine or tobacco, such as cigarettes, e-cigarettes, and chewing tobacco. If you need help quitting, ask your health care provider.  Do not use street drugs.  Do not share needles.  Ask your health care provider for help if you need support or information about quitting drugs. Alcohol use  Do not drink alcohol if: ? Your health care provider tells you not to drink. ? You are pregnant, may be pregnant, or are planning to become pregnant.  If you drink alcohol: ? Limit how much you use to 0-1 drink a day. ? Limit intake if you are breastfeeding.  Be aware of how much alcohol is in your drink. In the [place] one drink equals one 12  oz bottle of beer (355 mL), one 5 oz glass of wine (148 mL), or one 1 oz glass of hard liquor (44 mL). General instructions  Schedule regular health, dental, and eye exams.  Stay current with your vaccines.  Tell your health care provider if: ? You often feel depressed. ? You have ever been abused or do not feel safe at home. Summary  Adopting a healthy lifestyle and getting preventive care are important in promoting health and wellness.  Follow your health care provider's instructions about healthy diet, exercising, and getting tested or screened for diseases.  Follow your health care provider's instructions on monitoring your cholesterol and blood pressure. This information is not intended to replace advice given to you by your health care provider. Make sure you discuss any questions you have with your health care provider. Document Revised: 05/23/2018 Document Reviewed: 05/23/2018 Elsevier Patient Education  2020 [employer].

## 2019-09-10 NOTE — Progress Notes (Signed)
Subjective:    Patient ID: Erin Orr, female    DOB: 1960/10/07, 59 y.o.   MRN: [id number]  CC: Pedro Oldenburg is a 59 y.o. female who presents today for physical exam.    HPI: Feels well.  'Very active and happy. '  HTN- stopped lisinopril for the past 3 days due to nausea, since resolved. Compliant with metoprolol. No cp, sob.   Would like moderna vaccine.  No problems with prior vaccine nor h/o anaphylaxis.     Colorectal Cancer Screening: due Breast Cancer Screening: Mammogram due Cervical Cancer Screening: due Bone Health screening/DEXA for 65+: No increased fracture risk. Defer screening at this time. Lung Cancer Screening: Doesn't have 30 year pack year history and age > 47 years.       Tetanus - due; will defer as getting covid 19 vaccine.        Labs: Screening labs today.  Alcohol use: none Smoking/tobacco use: Nonsmoker.   HISTORY:  Past Medical History:  Diagnosis Date  . Hypertension     History reviewed. No pertinent surgical history. Family History  Problem Relation Age of Onset  . Heart disease Brother        has 3 brothers  . Heart attack Brother   . Colon cancer Neg Hx   . Breast cancer Neg Hx       ALLERGIES: Patient has no known allergies.  Current Outpatient Medications on File Prior to Visit  Medication Sig Dispense Refill  . metoprolol succinate (TOPROL-XL) 100 MG 24 hr tablet Take 1 tablet by mouth daily.     No current facility-administered medications on file prior to visit.    Social History   Tobacco Use  . Smoking status: Never Smoker  . Smokeless tobacco: Never Used  Substance Use Topics  . Alcohol use: Never  . Drug use: Never    Review of Systems  Constitutional: Negative for chills and fever.  Respiratory: Negative for cough.   Cardiovascular: Negative for chest pain and palpitations.  Gastrointestinal: Negative for nausea and vomiting.      Objective:    BP (!) 142/92   Pulse 77   Temp  97.7 F (36.5 C) (Temporal)   Ht 5\' 7"  (1.702 m)   Wt 200 lb 3.2 oz (90.8 kg)   SpO2 98%   BMI 31.36 kg/m   BP Readings from Last 3 Encounters:  09/10/19 (!) 142/92  09/09/17 (!) 152/87  09/23/16 (!) 177/87   Wt Readings from Last 3 Encounters:  09/10/19 200 lb 3.2 oz (90.8 kg)  08/05/19 200 lb (90.7 kg)  09/09/17 200 lb (90.7 kg)    Physical Exam Vitals reviewed.  Constitutional:      Appearance: She is well-developed.  Eyes:     Conjunctiva/sclera: Conjunctivae normal.  Neck:     Thyroid: No thyroid mass or thyromegaly.  Cardiovascular:     Rate and Rhythm: Normal rate and regular rhythm.     Pulses: Normal pulses.     Heart sounds: Normal heart sounds.  Pulmonary:     Effort: Pulmonary effort is normal.     Breath sounds: Normal breath sounds. No wheezing, rhonchi or rales.  Chest:     Breasts: Breasts are symmetrical.        Right: No inverted nipple, mass, nipple discharge, skin change or tenderness.        Left: No inverted nipple, mass, nipple discharge, skin change or tenderness.  Genitourinary:    Cervix: No cervical  motion tenderness, discharge or friability.     Uterus: Not enlarged, not fixed and not tender.      Adnexa:        Right: No mass, tenderness or fullness.         Left: No mass, tenderness or fullness.       Comments: Pap performed. No CMT. Unable to appreciated ovaries. Lymphadenopathy:     Head:     Right side of head: No submental, submandibular, tonsillar, preauricular, posterior auricular or occipital adenopathy.     Left side of head: No submental, submandibular, tonsillar, preauricular, posterior auricular or occipital adenopathy.     Cervical:     Right cervical: No superficial, deep or posterior cervical adenopathy.    Left cervical: No superficial, deep or posterior cervical adenopathy.     Upper Body:     Right upper body: No pectoral adenopathy.     Left upper body: No pectoral adenopathy.  Skin:    General: Skin is warm and  dry.  Neurological:     Mental Status: She is alert.  Psychiatric:        Speech: Speech normal.        Behavior: Behavior normal.        Thought Content: Thought content normal.        Assessment & Plan:   Problem List Items Addressed This Visit      Cardiovascular and Mediastinum   Hypertension - Primary    Uncontrolled.  Patient has been off lisinopril.  We will continue metoprolol and will start amlodipine today.  Patient will monitor blood pressure at home.  Close follow-up      Relevant Medications   amLODipine (NORVASC) 5 MG tablet     Other   Routine physical examination    Clinical breast exam and Pap smear performed today.  Screening labs ordered.  Patient declined colonoscopy due to Covid at this time.  She will call the office when she is ready to pursue this.  She will however schedule her mammogram.      Relevant Orders   Cytology - PAP       I have discontinued Jozie Wulf. Dolley's lisinopril. I am also having her start on amLODipine. Additionally, I am having her maintain her metoprolol succinate.   Meds ordered this encounter  Medications  . amLODipine (NORVASC) 5 MG tablet    Sig: Take 1 tablet (5 mg total) by mouth daily.    Dispense:  90 tablet    Refill:  3    Order Specific Question:   Supervising Provider    Answer:   Sherlene Shams [[id number]]    Return precautions given.   Risks, benefits, and alternatives of the medications and treatment plan prescribed today were discussed, and patient expressed understanding.   Education regarding symptom management and diagnosis given to patient on AVS.   Continue to follow with Allegra Grana, FNP for routine health maintenance.   Audria Nine Vaile and I agreed with plan.   Rennie Plowman, FNP

## 2019-09-10 NOTE — Assessment & Plan Note (Signed)
Uncontrolled.  Patient has been off lisinopril.  We will continue metoprolol and will start amlodipine today.  Patient will monitor blood pressure at home.  Close follow-up

## 2019-09-10 NOTE — Addendum Note (Signed)
Addended by: Warden Fillers on: 09/10/2019 11:20 AM   Modules accepted: Orders

## 2019-09-10 NOTE — Assessment & Plan Note (Signed)
Clinical breast exam and Pap smear performed today.  Screening labs ordered.  Patient declined colonoscopy due to Covid at this time.  She will call the office when she is ready to pursue this.  She will however schedule her mammogram.

## 2019-09-11 ENCOUNTER — Other Ambulatory Visit: Payer: Self-pay | Admitting: Family

## 2019-09-11 ENCOUNTER — Encounter: Payer: Self-pay | Admitting: Family

## 2019-09-11 LAB — CYTOLOGY - PAP
Comment: NEGATIVE
Diagnosis: NEGATIVE
High risk HPV: NEGATIVE

## 2019-09-23 ENCOUNTER — Other Ambulatory Visit: Payer: Self-pay

## 2019-09-23 ENCOUNTER — Other Ambulatory Visit (INDEPENDENT_AMBULATORY_CARE_PROVIDER_SITE_OTHER): Payer: Medicare HMO

## 2019-09-23 ENCOUNTER — Telehealth: Payer: Self-pay | Admitting: Family

## 2019-09-23 NOTE — Addendum Note (Signed)
Addended by: Warden Fillers on: 09/23/2019 10:38 AM   Modules accepted: Orders

## 2019-09-23 NOTE — Telephone Encounter (Signed)
Left message for patient to call back and schedule Medicare Annual Wellness Visit (AWV) either virtually or audio only.  Last AWV date is unknown but she has had one before; please schedule at anytime with Denisa O'Brien-Blaney at [facility].  Eligible for AWV-S started 03/13/2013 per [employer]

## 2019-09-24 LAB — ALKALINE PHOSPHATASE: Alkaline phosphatase (APISO): 143 U/L (ref 37–153)

## 2019-09-24 LAB — PTH, INTACT AND CALCIUM
Calcium: 11.3 mg/dL — ABNORMAL HIGH (ref 8.6–10.4)
PTH: 61 pg/mL (ref 14–64)

## 2019-09-26 ENCOUNTER — Ambulatory Visit (INDEPENDENT_AMBULATORY_CARE_PROVIDER_SITE_OTHER): Payer: Medicare HMO

## 2019-09-26 VITALS — Ht 66.5 in | Wt 200.0 lb

## 2019-09-26 DIAGNOSIS — Z760 Encounter for issue of repeat prescription: Secondary | ICD-10-CM | POA: Diagnosis not present

## 2019-09-26 DIAGNOSIS — Z0000 Encounter for general adult medical examination without abnormal findings: Secondary | ICD-10-CM | POA: Diagnosis not present

## 2019-09-26 NOTE — Patient Instructions (Addendum)
  Erin Orr , Thank you for taking time to come for your Medicare Wellness Visit. I appreciate your ongoing commitment to your health goals. Please review the following plan we discussed and let me know if I can assist you in the future.   These are the goals we discussed: Goals      Patient Stated   . I plan to start running track again (pt-stated)       This is a list of the screening recommended for you and due dates:  Health Maintenance  Topic Date Due  . Colon Cancer Screening  09/25/2020*  . Tetanus Vaccine  09/25/2020*  . Flu Shot  01/12/2020  . Mammogram  04/17/2020  . Pap Smear  09/10/2022  .  Hepatitis C: One time screening is recommended by Center for Disease Control  (CDC) for  adults born from [birth year] through 1965.   Discontinued  . HIV Screening  Discontinued  *Topic was postponed. The date shown is not the original due date.

## 2019-09-26 NOTE — Progress Notes (Signed)
Subjective:   Erin Orr is a 59 y.o. female who presents for an Initial Medicare Annual Wellness Visit.  Review of Systems    No ROS.  Medicare Wellness Virtual Visit.  Visual/audio telehealth visit, UTA vital signs.   Ht/Wt provided  See social history for additional risk factors.  Cardiac Risk Factors include: hypertension     Objective:    Today's Vitals   09/26/19 1231  Weight: 200 lb (90.7 kg)  Height: 5' 6.5" (1.689 m)   Body mass index is 31.8 kg/m.  Advanced Directives 09/26/2019 09/23/2016  Does Patient Have a Medical Advance Directive? Yes No  Type of Estate agent of [place];Living will -  Does patient want to make changes to medical advance directive? No - Patient declined -  Copy of Healthcare Power of Attorney in Chart? No - copy requested -  Would patient like information on creating a medical advance directive? - No - Patient declined    Current Medications (verified) Outpatient Encounter Medications as of 09/26/2019  Medication Sig  . amLODipine (NORVASC) 5 MG tablet Take 1 tablet (5 mg total) by mouth daily.  . metoprolol succinate (TOPROL-XL) 100 MG 24 hr tablet Take 1 tablet by mouth daily.   No facility-administered encounter medications on file as of 09/26/2019.    Allergies (verified) Patient has no known allergies.   History: Past Medical History:  Diagnosis Date  . Hypertension    History reviewed. No pertinent surgical history. Family History  Problem Relation Age of Onset  . Heart disease Brother        has 3 brothers  . Heart attack Brother   . Colon cancer Neg Hx   . Breast cancer Neg Hx    Social History   Socioeconomic History  . Marital status: Married    Spouse name: Not on file  . Number of children: Not on file  . Years of education: Not on file  . Highest education level: Not on file  Occupational History  . Not on file  Tobacco Use  . Smoking status: Never Smoker  . Smokeless  tobacco: Never Used  Substance and Sexual Activity  . Alcohol use: Never  . Drug use: Never  . Sexual activity: Not on file  Other Topics Concern  . 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 Difficulty of Paying Living Expenses:   Food Insecurity:   . Worried About Programme researcher, broadcasting/film/video in the Last Year:   . Barista in the Last Year:   Transportation Needs:   . Freight forwarder (Medical):   Marland Kitchen Lack of Transportation (Non-Medical):   Physical Activity:   . Days of Exercise per Week:   . Minutes of Exercise per Session:   Stress:   . Feeling of Stress :   Social Connections:   . Frequency of Communication with Friends and Family:   . Frequency of Social Gatherings with Friends and Family:   . Attends Religious Services:   . Active Member of Clubs or Organizations:   . Attends Banker Meetings:   Marland Kitchen Marital Status:     Tobacco Counseling Counseling given: Not Answered   Clinical Intake:  Pre-visit preparation completed: Yes        Diabetes: No  How often do you need to have  someone help you when you read instructions, pamphlets, or other written materials from your doctor or pharmacy?: 1 - Never  Interpreter Needed?: No      Activities of Daily Living In your present state of health, do you have any difficulty performing the following activities: 09/26/2019 08/05/2019  Hearing? N N  Vision? N N  Difficulty concentrating or making decisions? N N  Walking or climbing stairs? N N  Dressing or bathing? N N  Doing errands, shopping? N N  Preparing Food and eating ? N -  Using the Toilet? N -  In the past six months, have you accidently leaked urine? N -  Do you have problems with loss of bowel control? N -  Managing your Medications? N -  Managing your Finances? N -  Housekeeping or managing your Housekeeping? N -  Some recent  data might be hidden     Immunizations and Health Maintenance Immunization History  Administered Date(s) Administered  . Moderna SARS-COVID-2 Vaccination 09/10/2019   There are no preventive care reminders to display for this patient.  Patient Care Team: Allegra Grana, FNP as PCP - General (Family Medicine)  Indicate any recent Medical Services you may have received from other than [facility] providers in the past year (date may be approximate).     Assessment:   This is a routine wellness examination for Erin Orr.  Nurse connected with patient 09/26/19 at 12:30 PM EDT by a telephone enabled telemedicine application and verified that I am speaking with the correct person using two identifiers. Patient stated full name and DOB. Patient gave permission to continue with virtual visit. Patient's location was at home and Nurse's location was at [place] office.   Patient is alert and oriented x3. Patient denies difficulty focusing or concentrating. Patient likes to read, meditate, watches news and enjoys being around people for brain health.  Health Maintenance Due: -Shingles vaccine- discussed; deferred per patient request.  -Colonoscopy- deferred due to patient preference. Agrees to notify PMD when ready to schedule.  See completed HM at the end of note.   Eye: Visual acuity not assessed. Virtual visit. Followed by their ophthalmologist.  Dental: UTD  Hearing: Demonstrates normal hearing during visit.  Safety:  Patient feels safe at home- yes Patient does have smoke detectors at home- yes Patient does wear sunscreen or protective clothing when in direct sunlight - yes Patient does wear seat belt when in a moving vehicle - yes Patient drives- yes Adequate lighting in walkways free from debris- yes Grab bars and handrails used as appropriate- yes Ambulates with an assistive device- no  Social: Alcohol intake - no  Smoking history- never   Smokers in home? none Illicit  drug use? none  Medication: Taking as directed and without issues.  Self managed - yes   Covid-19: Precautions and sickness symptoms discussed. Wears mask, social distancing, hand hygiene as appropriate.   Activities of Daily Living Patient denies needing assistance with: household chores, feeding themselves, getting from bed to chair, getting to the toilet, bathing/showering, dressing, managing money, or preparing meals.   Discussed the importance of a healthy diet, water intake and the benefits of aerobic exercise.   Physical activity- yard work, no routine. Plans to increase physical activity with running.   Diet:  Regular Water: fair intake. Encouraged to stay hydrated.  Gatorade -yes  Caffeine: none  Other Providers Patient Care Team: Allegra Grana, FNP as PCP - General (Family Medicine)  Hearing/Vision screen  Hearing Screening  125Hz  250Hz  500Hz  1000Hz  2000Hz  3000Hz  4000Hz  6000Hz  8000Hz   Right ear:           Left ear:           Comments: Patient is able to hear conversational tones without difficulty.  No issues reported.   Vision Screening Comments: Visual acuity not assessed, virtual visit.  They have seen their ophthalmologist.     Dietary issues and exercise activities discussed: Current Exercise Habits: Home exercise routine, Intensity: Mild  Goals      Patient Stated   . I plan to start running track again (pt-stated)      Depression Screen PHQ 2/9 Scores 09/26/2019 08/05/2019  PHQ - 2 Score 0 0    Fall Risk Fall Risk  09/26/2019 08/05/2019  Falls in the past year? 0 0  Follow up Falls evaluation completed Falls evaluation completed    Timed Get Up and Go Performed no, virtual visit  Cognitive Function: MMSE - Mini Mental State Exam 09/26/2019  Not completed: Unable to complete        Screening Tests Health Maintenance  Topic Date Due  . COLONOSCOPY  09/25/2020 (Originally 01/16/2010)  . TETANUS/TDAP  09/25/2020 (Originally 01/17/1979)    . INFLUENZA VACCINE  01/12/2020  . MAMMOGRAM  04/17/2020  . PAP SMEAR-Modifier  09/10/2022  . Hepatitis C Screening  Discontinued  . HIV Screening  Discontinued     Plan:   Keep all routine maintenance appointments.   Follow up 10/11/19 @ 10:00  Request refill for metoprolol. Pended for approval. Deferred to PMD. Send to pharmacy on file.   Medicare Attestation I have personally reviewed: The patient's medical and social history Their use of alcohol, tobacco or illicit drugs Their current medications and supplements The patient's functional ability including ADLs,fall risks, home safety risks, cognitive, and hearing and visual impairment Diet and physical activities Evidence for depression   I have reviewed and discussed with patient certain preventive protocols, quality metrics, and best practice recommendations.   Varney Biles, LPN   3/97/6734    Agree with plan. Mable Paris, NP

## 2019-09-30 ENCOUNTER — Other Ambulatory Visit: Payer: Self-pay | Admitting: Family

## 2019-10-02 ENCOUNTER — Telehealth: Payer: Self-pay | Admitting: Family

## 2019-10-02 MED ORDER — METOPROLOL SUCCINATE ER 100 MG PO TB24: 100.0000 mg | ORAL_TABLET | Freq: Every day | ORAL | 3 refills | Status: DC

## 2019-10-02 NOTE — Telephone Encounter (Signed)
LMTCB

## 2019-10-02 NOTE — Telephone Encounter (Signed)
Call pt Advise her that I placed ref to endocine ( see result note)  I had also placed consult with hematology however want her to know that after speaking with hematology would advise her to wait on seeing endocrine FIRST  We can see hematology after endocrine IF that is necessary  [place], you may cancel referral to hematology, Dr  Cathie Hoops.

## 2019-10-03 NOTE — Telephone Encounter (Signed)
Called LVM 3 different dates and times for pt to return call but no call back, also called referring office informing them that I could not reach patient. MR  Referral was already closed since 10/03/2019.

## 2019-10-08 NOTE — Telephone Encounter (Signed)
Please circle back with pt She needs to see endocrine Initially I had thought hematology but we will hold on that.  Please review my note below with her and see if she will agree with endocrine consult

## 2019-10-10 NOTE — Telephone Encounter (Signed)
Patient has appointment 4/30. Since I am unable to reach patient I will discuss with her then.

## 2019-10-11 ENCOUNTER — Other Ambulatory Visit: Payer: Self-pay

## 2019-10-11 ENCOUNTER — Encounter: Payer: Self-pay | Admitting: Family

## 2019-10-11 ENCOUNTER — Ambulatory Visit (INDEPENDENT_AMBULATORY_CARE_PROVIDER_SITE_OTHER): Payer: Medicare HMO | Admitting: Family

## 2019-10-11 DIAGNOSIS — R232 Flushing: Secondary | ICD-10-CM

## 2019-10-11 DIAGNOSIS — I10 Essential (primary) hypertension: Secondary | ICD-10-CM

## 2019-10-11 LAB — GAMMA GT: GGT: 15 U/L (ref 7–51)

## 2019-10-11 MED ORDER — GABAPENTIN 100 MG PO CAPS: ORAL_CAPSULE | ORAL | 1 refills | Status: DC

## 2019-10-11 NOTE — Assessment & Plan Note (Signed)
No B symptoms. Discussed working diagnosis of menopausal symptom.  Discussed light clothing, fan. She would also like to trial gabapentin for symptom control.

## 2019-10-11 NOTE — Addendum Note (Signed)
Addended by: Warden Fillers on: 10/11/2019 10:59 AM   Modules accepted: Orders

## 2019-10-11 NOTE — Patient Instructions (Addendum)
Please call  and schedule your  bone density scan as discussed.   [place]  [place], [place]  Very important to see endocrine as well due to your elevated calcium.  Please make sure you look out for this phone call with an appointment. Let us know if you dont hear back within a week in regards to an appointment being scheduled.    Trial gabapentin for hot flashes at bedtime.   Gabapentin capsules or tablets What is this medicine? GABAPENTIN (GA ba pen tin) is used to control seizures in certain types of epilepsy. It is also used to treat certain types of nerve pain. This medicine may be used for other purposes; ask your health care provider or pharmacist if you have questions. COMMON BRAND NAME(S): Active-PAC with Gabapentin, Gabarone, Neurontin What should I tell my health care provider before I take this medicine? They need to know if you have any of these conditions:  history of drug abuse or alcohol abuse problem  kidney disease  lung or breathing disease  suicidal thoughts, plans, or attempt; a previous suicide attempt by you or a family member  an unusual or allergic reaction to gabapentin, other medicines, foods, dyes, or preservatives  pregnant or trying to get pregnant  breast-feeding How should I use this medicine? Take this medicine by mouth with a glass of water. Follow the directions on the prescription label. You can take it with or without food. If it upsets your stomach, take it with food. Take your medicine at regular intervals. Do not take it more often than directed. Do not stop taking except on your doctor's advice. If you are directed to break the 600 or 800 mg tablets in half as part of your dose, the extra half tablet should be used for the next dose. If you have not used the extra half tablet within 28 days, it should be thrown away. A special MedGuide will be given to you by the pharmacist with  each prescription and refill. Be sure to read this information carefully each time. Talk to your pediatrician regarding the use of this medicine in children. While this drug may be prescribed for children as young as 3 years for selected conditions, precautions do apply. Overdosage: If you think you have taken too much of this medicine contact a poison control center or emergency room at once. NOTE: This medicine is only for you. Do not share this medicine with others. What if I miss a dose? If you miss a dose, take it as soon as you can. If it is almost time for your next dose, take only that dose. Do not take double or extra doses. What may interact with this medicine? This medicine may interact with the following medications:  alcohol  antihistamines for allergy, cough, and cold  certain medicines for anxiety or sleep  certain medicines for depression like amitriptyline, fluoxetine, sertraline  certain medicines for seizures like phenobarbital, primidone  certain medicines for stomach problems  general anesthetics like halothane, isoflurane, methoxyflurane, propofol  local anesthetics like lidocaine, pramoxine, tetracaine  medicines that relax muscles for surgery  narcotic medicines for pain  phenothiazines like chlorpromazine, mesoridazine, prochlorperazine, thioridazine This list may not describe all possible interactions. Give your health care provider a list of all the medicines, herbs, non-prescription drugs, or dietary supplements you use. Also tell them if you smoke, drink alcohol, or use illegal drugs. Some items may interact with your medicine.  What should I watch for while using this medicine? Visit your doctor or health care provider for regular checks on your progress. You may want to keep a record at home of how you feel your condition is responding to treatment. You may want to share this information with your doctor or health care provider at each visit. You should  contact your doctor or health care provider if your seizures get worse or if you have any new types of seizures. Do not stop taking this medicine or any of your seizure medicines unless instructed by your doctor or health care provider. Stopping your medicine suddenly can increase your seizures or their severity. This medicine may cause serious skin reactions. They can happen weeks to months after starting the medicine. Contact your health care provider right away if you notice fevers or flu-like symptoms with a rash. The rash may be red or purple and then turn into blisters or peeling of the skin. Or, you might notice a red rash with swelling of the face, lips or lymph nodes in your neck or under your arms. Wear a medical identification bracelet or chain if you are taking this medicine for seizures, and carry a card that lists all your medications. You may get drowsy, dizzy, or have blurred vision. Do not drive, use machinery, or do anything that needs mental alertness until you know how this medicine affects you. To reduce dizzy or fainting spells, do not sit or stand up quickly, especially if you are an older patient. Alcohol can increase drowsiness and dizziness. Avoid alcoholic drinks. Your mouth may get dry. Chewing sugarless gum or sucking hard candy, and drinking plenty of water will help. The use of this medicine may increase the chance of suicidal thoughts or actions. Pay special attention to how you are responding while on this medicine. Any worsening of mood, or thoughts of suicide or dying should be reported to your health care provider right away. Women who become pregnant while using this medicine may enroll in the [place] American Antiepileptic Drug Pregnancy Registry by calling 606-053-6621. This registry collects information about the safety of antiepileptic drug use during pregnancy. What side effects may I notice from receiving this medicine? Side effects that you should report to your  doctor or health care professional as soon as possible:  allergic reactions like skin rash, itching or hives, swelling of the face, lips, or tongue  breathing problems  rash, fever, and swollen lymph nodes  redness, blistering, peeling or loosening of the skin, including inside the mouth  suicidal thoughts, mood changes Side effects that usually do not require medical attention (report to your doctor or health care professional if they continue or are bothersome):  dizziness  drowsiness  headache  nausea, vomiting  swelling of ankles, feet, hands  tiredness This list may not describe all possible side effects. Call your doctor for medical advice about side effects. You may report side effects to FDA at 1-800-FDA-1088. Where should I keep my medicine? Keep out of reach of children. This medicine may cause accidental overdose and death if it taken by other adults, children, or pets. Mix any unused medicine with a substance like cat litter or coffee grounds. Then throw the medicine away in a sealed container like a sealed bag or a coffee can with a lid. Do not use the medicine after the expiration date. Store at room temperature between 15 and 30 degrees C (59 and 86 degrees F). NOTE: This sheet is a summary.  It may not cover all possible information. If you have questions about this medicine, talk to your doctor, pharmacist, or health care provider.  2020 Elsevier/Gold Standard (2018-08-31 14:16:43)

## 2019-10-11 NOTE — Assessment & Plan Note (Signed)
Suspect from hyperparathyroidism.  Reiterated the importance of consult with endocrine.  Patient politely declines seeing Dr. Tedd Sias again as she would like to go see a different endocrinologist.  I have messaged  referral  Coordinator

## 2019-10-11 NOTE — Assessment & Plan Note (Signed)
Stable. Continue regimen. 

## 2019-10-11 NOTE — Progress Notes (Signed)
Subjective:    Patient ID: Erin Orr, female    DOB: 1961-04-13, 59 y.o.   MRN: [id number]  CC: Erin Orr is a 59 y.o. female who presents today for follow up.   HPI: Complains of hot flashes across forehead for past 8 years,worsening. Occurring nightly.  Interested in medication.  Describes when laying in bed, feels hot and then cold. Sheets are not drenched in sweat. Endorses irritablily , trouble sleeping.  No depression.  No associated bone pain, fever, unusual weight loss. Good appetite.  No surgical history.  No trouble or pain with swallowing  LMP: over 8 years ago. No vaginal bleeding, pelvic.   HTN- started amlodipine  H/o hyperparathyroidism, elevated calcium.   Endocrine consult.    Alkaline phosphatase normalized 09/23/19 Due MM, DEXA Vit D low Prediabetes  HISTORY:  Past Medical History:  Diagnosis Date  . Hypertension    History reviewed. No pertinent surgical history. Family History  Problem Relation Age of Onset  . Heart disease Brother        has 3 brothers  . Heart attack Brother   . Colon cancer Neg Hx   . Breast cancer Neg Hx     Allergies: Patient has no known allergies. Current Outpatient Medications on File Prior to Visit  Medication Sig Dispense Refill  . amLODipine (NORVASC) 5 MG tablet Take 1 tablet (5 mg total) by mouth daily. 90 tablet 3  . metoprolol succinate (TOPROL-XL) 100 MG 24 hr tablet Take 1 tablet (100 mg total) by mouth daily. 60 tablet 3   No current facility-administered medications on file prior to visit.    Social History   Tobacco Use  . Smoking status: Never Smoker  . Smokeless tobacco: Never Used  Substance Use Topics  . Alcohol use: Never  . Drug use: Never    Review of Systems  Constitutional: Positive for diaphoresis. Negative for appetite change, chills ('hot flashes') and fever.  HENT: Negative for trouble swallowing and voice change.   Respiratory: Negative for cough.    Cardiovascular: Negative for chest pain and palpitations.  Gastrointestinal: Negative for nausea and vomiting.  Genitourinary: Negative for menstrual problem and pelvic pain.  Neurological: Negative for numbness and headaches.      Objective:    BP 120/82   Pulse 97   Temp (!) 96.8 F (36 C) (Temporal)   Ht 5\' 7"  (1.702 m)   Wt 202 lb 3.2 oz (91.7 kg)   SpO2 98%   BMI 31.67 kg/m  BP Readings from Last 3 Encounters:  10/11/19 120/82  09/10/19 (!) 142/92  09/09/17 (!) 152/87   Wt Readings from Last 3 Encounters:  10/11/19 202 lb 3.2 oz (91.7 kg)  09/26/19 200 lb (90.7 kg)  09/10/19 200 lb 3.2 oz (90.8 kg)    Physical Exam Vitals reviewed.  Constitutional:      Appearance: She is well-developed.  Eyes:     Conjunctiva/sclera: Conjunctivae normal.  Neck:     Thyroid: No thyroid mass, thyromegaly or thyroid tenderness.  Cardiovascular:     Rate and Rhythm: Normal rate and regular rhythm.     Pulses: Normal pulses.     Heart sounds: Normal heart sounds.  Pulmonary:     Effort: Pulmonary effort is normal.     Breath sounds: Normal breath sounds. No wheezing, rhonchi or rales.  Skin:    General: Skin is warm and dry.  Neurological:     Mental Status: She is alert.  Psychiatric:  Speech: Speech normal.        Behavior: Behavior normal.        Thought Content: Thought content normal.        Assessment & Plan:   Problem List Items Addressed This Visit      Cardiovascular and Mediastinum   Hot flashes    No B symptoms. Discussed working diagnosis of menopausal symptom.  Discussed light clothing, fan. She would also like to trial gabapentin for symptom control.       Relevant Medications   gabapentin (NEURONTIN) 100 MG capsule   Hypertension    Stable. Continue regimen        Other   Hypercalcemia    Suspect from hyperparathyroidism.  Reiterated the importance of consult with endocrine.  Patient politely declines seeing Dr. Tedd Sias again as she would  like to go see a different endocrinologist.  I have messaged  referral  Coordinator          I am having Alfrieda Tarry. Plucinski start on gabapentin. I am also having her maintain her amLODipine and metoprolol succinate.   Meds ordered this encounter  Medications  . gabapentin (NEURONTIN) 100 MG capsule    Sig: Take one tablet ( 100 mg) by mouth one hour before bedtime every night.    Dispense:  30 capsule    Refill:  1    Order Specific Question:   Supervising Provider    Answer:   Sherlene Shams [[id number]]    Return precautions given.   Risks, benefits, and alternatives of the medications and treatment plan prescribed today were discussed, and patient expressed understanding.   Education regarding symptom management and diagnosis given to patient on AVS.  Continue to follow with Allegra Grana, FNP for routine health maintenance.   Audria [place] and I agreed with plan.   Rennie Plowman, FNP

## 2019-10-14 ENCOUNTER — Telehealth: Payer: Self-pay | Admitting: Family

## 2019-10-14 NOTE — Telephone Encounter (Signed)
I left pt a vm regarding endo referral.

## 2019-10-18 LAB — ALKALINE PHOSPHATASE, ISOENZYMES
Alkaline Phosphatase: 162 IU/L — ABNORMAL HIGH (ref 39–117)
BONE FRACTION: 46 % (ref 14–68)
INTESTINAL FRAC.: 3 % (ref 0–18)
LIVER FRACTION: 51 % (ref 18–85)

## 2019-10-22 ENCOUNTER — Telehealth: Payer: Self-pay | Admitting: Family

## 2019-10-22 NOTE — Telephone Encounter (Signed)
Rejection Reason - Patient did not respond - Unable to contact pt to schedule appt. If patient still wants appt with endocrinology, they will need a new referral. Thanks!" [facility] said 1 day ago

## 2019-10-22 NOTE — Telephone Encounter (Signed)
Sorry for confusion here Pt had been to [facility] endo in the past and did not want to go there again.  Could we send to [place]?

## 2019-10-25 ENCOUNTER — Emergency Department: Payer: Medicare HMO

## 2019-10-25 ENCOUNTER — Other Ambulatory Visit: Payer: Self-pay

## 2019-10-25 ENCOUNTER — Emergency Department
Admission: EM | Admit: 2019-10-25 | Discharge: 2019-10-26 | Disposition: A | Discharge status: Other Acute Inpatient Hospital | Payer: Medicare HMO | Attending: Emergency Medicine | Admitting: Emergency Medicine

## 2019-10-25 DIAGNOSIS — R4182 Altered mental status, unspecified: Secondary | ICD-10-CM | POA: Diagnosis present

## 2019-10-25 DIAGNOSIS — F29 Unspecified psychosis not due to a substance or known physiological condition: Secondary | ICD-10-CM

## 2019-10-25 DIAGNOSIS — I10 Essential (primary) hypertension: Secondary | ICD-10-CM | POA: Diagnosis not present

## 2019-10-25 DIAGNOSIS — Z20822 Contact with and (suspected) exposure to covid-19: Secondary | ICD-10-CM | POA: Diagnosis not present

## 2019-10-25 DIAGNOSIS — F22 Delusional disorders: Secondary | ICD-10-CM

## 2019-10-25 DIAGNOSIS — Z79899 Other long term (current) drug therapy: Secondary | ICD-10-CM | POA: Insufficient documentation

## 2019-10-25 LAB — SARS CORONAVIRUS 2 BY RT PCR (HOSPITAL ORDER, PERFORMED IN ~~LOC~~ HOSPITAL LAB): SARS Coronavirus 2: NEGATIVE

## 2019-10-25 LAB — CBC
HCT: 44.9 % (ref 36.0–46.0)
Hemoglobin: 14.3 g/dL (ref 12.0–15.0)
MCH: 28.4 pg (ref 26.0–34.0)
MCHC: 31.8 g/dL (ref 30.0–36.0)
MCV: 89.1 fL (ref 80.0–100.0)
Platelets: 303 10*3/uL (ref 150–400)
RBC: 5.04 MIL/uL (ref 3.87–5.11)
RDW: 13.2 % (ref 11.5–15.5)
WBC: 5.8 10*3/uL (ref 4.0–10.5)
nRBC: 0 % (ref 0.0–0.2)

## 2019-10-25 LAB — COMPREHENSIVE METABOLIC PANEL
ALT: 12 U/L (ref 0–44)
AST: 18 U/L (ref 15–41)
Albumin: 4.7 g/dL (ref 3.5–5.0)
Alkaline Phosphatase: 141 U/L — ABNORMAL HIGH (ref 38–126)
Anion gap: 9 (ref 5–15)
BUN: 9 mg/dL (ref 6–20)
CO2: 28 mmol/L (ref 22–32)
Calcium: 11.6 mg/dL — ABNORMAL HIGH (ref 8.9–10.3)
Chloride: 103 mmol/L (ref 98–111)
Creatinine, Ser: 0.68 mg/dL (ref 0.44–1.00)
GFR calc Af Amer: >60 mL/min (ref >60)
GFR calc non Af Amer: >60 mL/min (ref >60)
Glucose, Bld: 95 mg/dL (ref 70–99)
Potassium: 4.1 mmol/L (ref 3.5–5.1)
Sodium: 140 mmol/L (ref 135–145)
Total Bilirubin: 1.3 mg/dL — ABNORMAL HIGH (ref 0.3–1.2)
Total Protein: 9 g/dL — ABNORMAL HIGH (ref 6.5–8.1)

## 2019-10-25 LAB — URINE DRUG SCREEN, QUALITATIVE (ARMC ONLY)
Amphetamines, Ur Screen: NOT DETECTED
Barbiturates, Ur Screen: NOT DETECTED
Benzodiazepine, Ur Scrn: NOT DETECTED
Cannabinoid 50 Ng, Ur ~~LOC~~: NOT DETECTED
Cocaine Metabolite,Ur ~~LOC~~: NOT DETECTED
MDMA (Ecstasy)Ur Screen: NOT DETECTED
Methadone Scn, Ur: NOT DETECTED
Opiate, Ur Screen: NOT DETECTED
Phencyclidine (PCP) Ur S: NOT DETECTED
Tricyclic, Ur Screen: NOT DETECTED

## 2019-10-25 LAB — ACETAMINOPHEN LEVEL: Acetaminophen (Tylenol), Serum: <10 ug/mL — ABNORMAL LOW (ref 10–30)

## 2019-10-25 LAB — SALICYLATE LEVEL: Salicylate Lvl: <7 mg/dL — ABNORMAL LOW (ref 7.0–30.0)

## 2019-10-25 LAB — TSH: TSH: 0.816 u[IU]/mL (ref 0.350–4.500)

## 2019-10-25 LAB — ETHANOL: Alcohol, Ethyl (B): <10 mg/dL (ref <10)

## 2019-10-25 MED ORDER — AMLODIPINE BESYLATE 5 MG PO TABS
5.0000 mg | ORAL_TABLET | Freq: Every day | ORAL | Status: DC
  Administered 2019-10-25 – 2019-10-26 (×2): 5 mg via ORAL
  Filled 2019-10-25 (×2): qty 1

## 2019-10-25 MED ORDER — GABAPENTIN 100 MG PO CAPS
100.0000 mg | ORAL_CAPSULE | Freq: Every day | ORAL | Status: DC
  Filled 2019-10-25: qty 1

## 2019-10-25 MED ORDER — LORAZEPAM 1 MG PO TABS: 1.0000 mg | ORAL_TABLET | ORAL | Status: DC | PRN

## 2019-10-25 MED ORDER — METOPROLOL SUCCINATE ER 50 MG PO TB24
100.0000 mg | ORAL_TABLET | Freq: Every day | ORAL | Status: DC
  Administered 2019-10-26: 100 mg via ORAL
  Filled 2019-10-25: qty 2

## 2019-10-25 NOTE — ED Notes (Signed)
Hourly rounding reveals patient awake in room. No complaints, stable, in no acute distress. Q15 minute rounds and monitoring via Security Cameras to continue. 

## 2019-10-25 NOTE — Telephone Encounter (Signed)
No problem it looks like Dr Everardo All is reviewing it.

## 2019-10-25 NOTE — ED Notes (Addendum)
Pt. Transferred to BHU from ED to room after screening for contraband. Report to include Situation, Background, Assessment and Recommendations from Ann RN. Pt. Oriented to unit including Q15 minute rounds as well as the security cameras for their protection. Patient is alert and oriented, warm and dry in no acute distress. Patient denies SI, HI, and AVH. Pt. Encouraged to let me know if needs arise.  

## 2019-10-25 NOTE — ED Triage Notes (Signed)
Pt arrives via sheriff who states pt's husband took out papers on her because she has not been eating, does not think that he is her husband even though they have been married for 27 years and is concerned for her safety. [name]&O to person and place but is unsure why she is here. Pt reports she has not been eating because "I dont trust them and my food are diifferent, I like bacon and things of that sort". Pt calm and cooperative during triage.

## 2019-10-25 NOTE — ED Notes (Signed)
Patient with $105.00 in her belongings, security called to safeguard patients money. Yellow slip with key placed in pysis.

## 2019-10-25 NOTE — ED Notes (Signed)
Patient laying in bed in what appears catatonic state. Patient not moving eyes opened just staring up at ceiling, does not answer to name.

## 2019-10-25 NOTE — ED Notes (Signed)
Pt dressed out by [place] and this RN and dressed in burgundy scrubs.   Items removed from patient  1 blue and white checkered blouse 1 black bra  2 black sneakers 1 pair of blue jeans  1 pair of white panties  1 small brown pocket book   5- $20 bills 1- $5 bill  Money placed in biohazard bag and labeled with patient label

## 2019-10-25 NOTE — ED Notes (Signed)
Pt valuable belongings to be lock in hospital safe give to [place], [place]. 5x $20 bills 1x $5 bill  Money placed in biohazard bag and labeled with patient label

## 2019-10-25 NOTE — ED Notes (Signed)
Ct head completed

## 2019-10-25 NOTE — ED Notes (Signed)
Patient with stoic affect, poor historian. Awaiting psych eval. Labs dran in triage prior to rooming patient.

## 2019-10-25 NOTE — ED Provider Notes (Addendum)
[facility] EMERGENCY DEPARTMENT Provider Note   CSN: [id number] Arrival date & time: 10/25/19  1505     History Chief Complaint  Patient presents with  . Psychiatric Evaluation    Erin Orr is a 59 y.o. female history of hypertension, schizophrenia who presenting with altered mental status, confusion.  Per the husband, patient has been progressively more agitated and confused.  Patient has been not recognizing her husband.  She thought that he is an impostor.  She also has not been eating.  She stopped going to the grocery store because she was concerned that the store has things that are not made from the [place].  Patient also has been hallucinating. She thinks she is an angel to save everyone from hell.  She is depressed because she is unable to save everyone.  He states that she was hospitalized for psychiatric breakdown in [place] previously. She is IVC by her husband   The history is provided by the patient.       Past Medical History:  Diagnosis Date  . Hypertension     Patient Active Problem List   Diagnosis Date Noted  . Hot flashes 10/11/2019  . Hypercalcemia 10/11/2019  . Routine physical examination 08/05/2019  . Hypertension 08/05/2019  . Lumbar paraspinal muscle spasm 07/03/2014  . Lumbar radiculitis 07/03/2014  . Cervical paraspinal muscle spasm 03/31/2014    History reviewed. No pertinent surgical history.   OB History   No obstetric history on file.     Family History  Problem Relation Age of Onset  . Heart disease Brother        has 3 brothers  . Heart attack Brother   . Colon cancer Neg Hx   . Breast cancer Neg Hx     Social History   Tobacco Use  . Smoking status: Never Smoker  . Smokeless tobacco: Never Used  Substance Use Topics  . Alcohol use: Never  . Drug use: Never    Home Medications Prior to Admission medications   Medication Sig Start Date End Date Taking? Authorizing Provider  amLODipine  (NORVASC) 5 MG tablet Take 1 tablet (5 mg total) by mouth daily. 09/10/19   Allegra Grana, FNP  gabapentin (NEURONTIN) 100 MG capsule Take one tablet ( 100 mg) by mouth one hour before bedtime every night. 10/11/19   Allegra Grana, FNP  metoprolol succinate (TOPROL-XL) 100 MG 24 hr tablet Take 1 tablet (100 mg total) by mouth daily. 10/02/19   Allegra Grana, FNP    Allergies    Patient has no known allergies.  Review of Systems   Review of Systems  Psychiatric/Behavioral: Positive for dysphoric mood and hallucinations.  All other systems reviewed and are negative.   Physical Exam Updated Vital Signs BP (!) 165/109 (BP Location: Right Arm)   Pulse (!) 102   Temp 98.7 F (37.1 C) (Oral)   Resp 18   Ht 5\' 6"  (1.676 m)   Wt 90.7 kg   SpO2 100%   BMI 32.28 kg/m   Physical Exam Vitals and nursing note reviewed.  HENT:     Head: Normocephalic.     Nose: Nose normal.     Mouth/Throat:     Mouth: Mucous membranes are moist.  Eyes:     Extraocular Movements: Extraocular movements intact.     Pupils: Pupils are equal, round, and reactive to light.  Cardiovascular:     Rate and Rhythm: Normal rate and regular rhythm.  Pulses: Normal pulses.     Heart sounds: Normal heart sounds.  Pulmonary:     Effort: Pulmonary effort is normal.     Breath sounds: Normal breath sounds.  Abdominal:     General: Abdomen is flat.     Palpations: Abdomen is soft.  Musculoskeletal:        General: Normal range of motion.     Cervical back: Normal range of motion.  Skin:    General: Skin is warm.     Capillary Refill: Capillary refill takes less than 2 seconds.  Neurological:     General: No focal deficit present.     Mental Status: She is alert.  Psychiatric:     Comments: Depressed, poor judgment      ED Results / Procedures / Treatments   Labs (all labs ordered are listed, but only abnormal results are displayed) Labs Reviewed  COMPREHENSIVE METABOLIC PANEL -  Abnormal; Notable for the following components:      Result Value   Calcium 11.6 (*)    Total Protein 9.0 (*)    Alkaline Phosphatase 141 (*)    Total Bilirubin 1.3 (*)    All other components within normal limits  SALICYLATE LEVEL - Abnormal; Notable for the following components:   Salicylate Lvl <4.4 (*)    All other components within normal limits  ACETAMINOPHEN LEVEL - Abnormal; Notable for the following components:   Acetaminophen (Tylenol), Serum <10 (*)    All other components within normal limits  ETHANOL  URINE DRUG SCREEN, QUALITATIVE ([facility] ONLY)  CBC  TSH  POC URINE PREG, ED    EKG None  Radiology CT Head Wo Contrast  Result Date: 10/25/2019 CLINICAL DATA:  Transient ischemic attack. EXAM: CT HEAD WITHOUT CONTRAST TECHNIQUE: Contiguous axial images were obtained from the base of the skull through the vertex without intravenous contrast. COMPARISON:  September 23, 2016. FINDINGS: Brain: No evidence of acute infarction, hemorrhage, hydrocephalus, extra-axial collection or mass lesion/mass effect. Vascular: No hyperdense vessel or unexpected calcification. Skull: Normal. Negative for fracture or focal lesion. Sinuses/Orbits: No acute finding. Other: None. IMPRESSION: Normal head CT. Electronically Signed   By: Marijo Conception M.D.   On: 10/25/2019 17:13    Procedures Procedures (including critical care time)  Medications Ordered in ED Medications - No data to display  ED Course  I have reviewed the triage vital signs and the nursing notes.  Pertinent labs & imaging results that were available during my care of the patient were reviewed by me and considered in my medical decision making (see chart for details).    MDM Rules/Calculators/A&P                      Erin Orr is a 60 y.o. female who presented with psychosis.  She has also been more forgetful. Consider frontal lobe mass versus psychosis. Will get psych clearance labs and CT head.  6:22 PM CT  head showed no mass. Labs unremarkable. Medically cleared for psych eval.   The patient has been placed in psychiatric observation due to the need to provide a safe environment for the patient while obtaining psychiatric consultation and evaluation, as well as ongoing medical and medication management to treat the patient's condition.  The patient has been placed under full IVC at this time.   Final Clinical Impression(s) / ED Diagnoses Final diagnoses:  None    Rx / DC Orders ED Discharge Orders    None  Charlynne Pander, MD 10/25/19 1919    Charlynne Pander, MD 10/25/19 [phone]

## 2019-10-26 ENCOUNTER — Encounter: Payer: Self-pay | Admitting: Psychiatric/Mental Health

## 2019-10-26 ENCOUNTER — Inpatient Hospital Stay
Admit: 2019-10-26 | Discharge: 2019-11-04 | DRG: 885 | Disposition: A | Discharge status: Home / Self Care | Payer: Medicare HMO | Attending: Psychiatry | Admitting: Psychiatry

## 2019-10-26 DIAGNOSIS — F22 Delusional disorders: Secondary | ICD-10-CM | POA: Diagnosis not present

## 2019-10-26 DIAGNOSIS — I10 Essential (primary) hypertension: Secondary | ICD-10-CM | POA: Diagnosis present

## 2019-10-26 DIAGNOSIS — R232 Flushing: Secondary | ICD-10-CM | POA: Diagnosis not present

## 2019-10-26 DIAGNOSIS — M5416 Radiculopathy, lumbar region: Secondary | ICD-10-CM | POA: Diagnosis present

## 2019-10-26 DIAGNOSIS — F259 Schizoaffective disorder, unspecified: Principal | ICD-10-CM | POA: Diagnosis present

## 2019-10-26 DIAGNOSIS — F329 Major depressive disorder, single episode, unspecified: Secondary | ICD-10-CM | POA: Diagnosis present

## 2019-10-26 DIAGNOSIS — F23 Brief psychotic disorder: Secondary | ICD-10-CM | POA: Diagnosis present

## 2019-10-26 DIAGNOSIS — F29 Unspecified psychosis not due to a substance or known physiological condition: Secondary | ICD-10-CM | POA: Diagnosis not present

## 2019-10-26 DIAGNOSIS — Z8249 Family history of ischemic heart disease and other diseases of the circulatory system: Secondary | ICD-10-CM

## 2019-10-26 DIAGNOSIS — N951 Menopausal and female climacteric states: Secondary | ICD-10-CM | POA: Diagnosis present

## 2019-10-26 LAB — URINALYSIS, ROUTINE W REFLEX MICROSCOPIC
Bacteria, UA: NONE SEEN
Bilirubin Urine: NEGATIVE
Glucose, UA: NEGATIVE mg/dL
Hgb urine dipstick: NEGATIVE
Ketones, ur: NEGATIVE mg/dL
Leukocytes,Ua: NEGATIVE
Nitrite: NEGATIVE
Protein, ur: 30 mg/dL — AB
Specific Gravity, Urine: 1.023 (ref 1.005–1.030)
WBC, UA: NONE SEEN WBC/hpf (ref 0–5)
pH: 5 (ref 5.0–8.0)

## 2019-10-26 MED ORDER — GABAPENTIN 100 MG PO CAPS: 100.0000 mg | ORAL_CAPSULE | Freq: Every day | ORAL | Status: DC

## 2019-10-26 MED ORDER — MAGNESIUM HYDROXIDE 400 MG/5ML PO SUSP: 30.0000 mL | Freq: Every day | ORAL | Status: DC | PRN

## 2019-10-26 MED ORDER — METOPROLOL SUCCINATE ER 100 MG PO TB24
100.0000 mg | ORAL_TABLET | Freq: Every day | ORAL | Status: DC
  Administered 2019-10-27 – 2019-11-04 (×8): 100 mg via ORAL
  Filled 2019-10-26 (×10): qty 1

## 2019-10-26 MED ORDER — QUETIAPINE FUMARATE 25 MG PO TABS: 25.0000 mg | ORAL_TABLET | Freq: Two times a day (BID) | ORAL | Status: DC

## 2019-10-26 MED ORDER — DIPHENHYDRAMINE HCL 25 MG PO CAPS
25.0000 mg | ORAL_CAPSULE | Freq: Four times a day (QID) | ORAL | Status: DC | PRN
  Administered 2019-10-27: 25 mg via ORAL
  Filled 2019-10-26: qty 1

## 2019-10-26 MED ORDER — ALUM & MAG HYDROXIDE-SIMETH 200-200-20 MG/5ML PO SUSP: 30.0000 mL | ORAL | Status: DC | PRN

## 2019-10-26 MED ORDER — LORAZEPAM 1 MG PO TABS: 1.0000 mg | ORAL_TABLET | ORAL | Status: DC | PRN

## 2019-10-26 MED ORDER — TRAZODONE HCL 50 MG PO TABS
50.0000 mg | ORAL_TABLET | Freq: Every evening | ORAL | Status: DC | PRN
  Administered 2019-10-27 – 2019-11-02 (×3): 50 mg via ORAL
  Filled 2019-10-26 (×3): qty 1

## 2019-10-26 MED ORDER — QUETIAPINE FUMARATE 25 MG PO TABS
25.0000 mg | ORAL_TABLET | Freq: Two times a day (BID) | ORAL | Status: DC
  Filled 2019-10-26: qty 1

## 2019-10-26 MED ORDER — AMLODIPINE BESYLATE 5 MG PO TABS
5.0000 mg | ORAL_TABLET | Freq: Every day | ORAL | Status: DC
  Administered 2019-10-27 – 2019-10-28 (×2): 5 mg via ORAL
  Filled 2019-10-26 (×3): qty 1

## 2019-10-26 MED ORDER — DIPHENHYDRAMINE HCL 25 MG PO CAPS: 25.0000 mg | ORAL_CAPSULE | Freq: Four times a day (QID) | ORAL | Status: DC | PRN

## 2019-10-26 MED ORDER — ACETAMINOPHEN 325 MG PO TABS
650.0000 mg | ORAL_TABLET | Freq: Four times a day (QID) | ORAL | Status: DC | PRN
  Filled 2019-10-26: qty 2

## 2019-10-26 NOTE — ED Notes (Signed)
Pt's husband called to state that pt has been getting paranoid and confused for the last three months and has been getting progressively worse. He states pt thinks she is an Chief Technology Officer and refuses to go to the grocery store thinking the items there are tainted. She does not think he is her husband, thinks she is just her friend. He states that she has had psychiatric admissions in [place] and in [place], [place]. He says that his wife will not take pills and does better with [place] acting injections. He is requesting that she be started back on her psych medications but could not say what she takes.

## 2019-10-26 NOTE — ED Notes (Signed)
EKG done

## 2019-10-26 NOTE — ED Notes (Signed)
Pt informed that her son called and requested that she call him back. Pt given phone to call him back.

## 2019-10-26 NOTE — ED Notes (Addendum)
Pt refused visit from husband

## 2019-10-26 NOTE — ED Notes (Signed)
Hourly rounding reveals patient awake in room. No complaints, stable, in no acute distress. Q15 minute rounds and monitoring via Security Cameras to continue. 

## 2019-10-26 NOTE — BH Specialist Note (Signed)
Patient is to be admitted to [facility] by Psychiatric Nurse Practitioner Juel Burrow.,.  Attending Physician will be Dr. Toni Amend.   Patient has been assigned to room 310, by [facility] Charge Nurse Britta Mccreedy.   ER staff is aware of the admission:  Dr. Silverio Lay, ER MD   [place] Hole Patient's Nurse   Leeroy Bock, Patient Access.

## 2019-10-26 NOTE — ED Notes (Addendum)
Hourly rounding reveals patient awake in room. No complaints, stable, in no acute distress. Q15 minute rounds and monitoring via Security Cameras to continue. 

## 2019-10-26 NOTE — ED Provider Notes (Signed)
  Physical Exam  BP (!) 151/94 (BP Location: Right Arm)   Pulse (!) 104   Temp 98.4 F (36.9 C) (Oral)   Resp 20   Ht 5\' 6"  (1.676 m)   Wt 90.7 kg   SpO2 100%   BMI 32.28 kg/m   Physical Exam  ED Course/Procedures     Procedures  MDM  Patient will be admitted to Behavioral health downstairs.     , MD 10/26/19 2127

## 2019-10-26 NOTE — ED Notes (Signed)
Pt insisting that she cannot stay here overnight because [employer] has not approved her to stay here. Pt states she needs to go to her job.

## 2019-10-26 NOTE — ED Provider Notes (Signed)
Emergency Medicine Observation Re-evaluation Note  Erin Orr is a 59 y.o. female, seen on rounds today.  Pt initially presented to the ED for complaints of Psychiatric Evaluation Currently, the patient is resting.  Physical Exam  BP (!) 156/99 (BP Location: Right Arm)   Pulse 83   Temp 98.3 F (36.8 C) (Oral)   Resp 18   Ht 1.676 m (5\' 6" )   Wt 90.7 kg   SpO2 98%   BMI 32.28 kg/m  Physical Exam  ED Course / MDM  EKG:    I have reviewed the labs performed to date as well as medications administered while in observation.  Recent changes in the last 24 hours include psychiatric evaluation. Plan  Current plan is for psychiatric disposition. Patient is under full IVC at this time.   , MD 10/26/19 [phone]

## 2019-10-26 NOTE — BH Assessment (Addendum)
Assessment Note  Erin Orr is an 59 y.o. female who presented to [facility] ED involuntarily for treatment. Per triage note, Pt arrives via sheriff who states pt's husband took out papers on her because she has not been eating, does not think that he is her husband even though they have been married for 27 years and is concerned for her safety. [name]&O to person and place but is unsure why she is here. Pt reports she has not been eating because "I dont trust them and my food are different, I like bacon and things of that sort". Pt calm and cooperative during triage.   During TTS assessment pt presented pleasant and calm with a tangential thought process. Pt appeared to be restless and when addressed pt stated "I didn't get any sleep last night" due to being in a "strange place". Pt reported to be sleeping 5 hours daily and stated "that's usually all I need and then I am good to go". Pt confirmed the information reported to triage RN and stated "things are just changing". Pt reported believing her husband has early Alzheimer's and gets confused a lot. Pt reported her husband to forget about the foods she no longer cooks are eat. Pt identified meats, breads, beans and stated "you know things like that are not good for [place]". Pt was unable to elaborate on her statement and continued to repeat "things are just changing". Pt reported losing 50lbs in the last two months due to dieting and her taste buds changing. Pt denied a MH or SA hx. Pt denied any current substance use. Pt denied an inpatient or outpatient hx and to be only taking medication prescribed for her blood pressure. Pt reported to be employed at [employer] and expressed concerns around getting her 2nd dose of the COVID19 vaccine. Pt denied anything recently taking place that would cause her to be feel unstable or unsafe. Pt abruptly stated "my husband is gone we are separated as of yesterday" but did not wish to go into details. Pt stated "after 20  years of being with the same person things just change". Pt denied any current SI/HI/AH/VH and voiced her preference in TTS contacting her son Erin Orr [phone]) versus her husband to gather collateral.  Collateral contact made with Erin Orr by phone: Erin Orr reported to be very concerned for pt's bizarre behaviors (wandering off ending up in [place], rapid weight loss, AH) that appears to have increased over the last few years. 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 Erin Orr reported pt refusing to eat anything or to allowing anyone else to eat certain foods due to paranoia. Erin Orr expressed no SI/HI concerns for pt but disclosed a previous hx of pt communicating threats to family members. Erin Orr identified pt's struggles with AH. Erin Orr reports pt disclosing his step father to be deceased when he is actually living and canceling out his insurance policy because "the voices told her he was dead". Erin Orr reported recently during Mother's Day dinner, pt stopped him from eating stating "they told me you can not eat that".  Erin Orr expressed feeling pt has a MH diagnosis that is going untreated and the need for inpatient treatment.   Diagnosis: Unspecified Depressive Disorder with psychotic features  Past Medical History:  Past Medical History:  Diagnosis Date  . Hypertension     History reviewed. No pertinent surgical history.  Family History:  Family History  Problem Relation Age of Onset  . Heart disease  Brother        has 3 brothers  . Heart attack Brother   . Colon cancer Neg Hx   . Breast cancer Neg Hx     Social History:  reports that she has never smoked. She has never used smokeless tobacco. She reports that she does not drink alcohol or use drugs.  Additional Social History:  Alcohol / Drug Use Pain Medications: see mar Prescriptions: see mar Over the Counter: see mar History of alcohol / drug use?: No history of alcohol / drug abuse  CIWA: CIWA-Ar BP: (!)  151/94 Pulse Rate: (!) 104 COWS:    Allergies: No Known Allergies  Home Medications: (Not in a hospital admission)   OB/GYN Status:  No LMP recorded. Patient is postmenopausal.  General Assessment Data Location of Assessment: [facility] ED TTS Assessment: In system Is this a Tele or Face-to-Face Assessment?: Face-to-Face Is this an Initial Assessment or a Re-assessment for this encounter?: Initial Assessment Patient Accompanied by:: N/A Language Other than English: No Living Arrangements: (Private home ) What gender do you identify as?: Female Marital status: Married Artist name: unknown  Pregnancy Status: No Living Arrangements: Spouse/significant other Can pt return to current living arrangement?: Yes Admission Status: Involuntary Petitioner: Family member Is patient capable of signing voluntary admission?: Yes Referral Source: Self/Family/Friend Insurance type: [employer] Medicare  Medical Screening Exam [facility] Walk-in ONLY) Medical Exam completed: Yes  Crisis Care Plan Living Arrangements: Spouse/significant other Legal Guardian: (n/a) Name of Psychiatrist: None reported  Name of Therapist: None reported   Education Status Is patient currently in school?: No Is the patient employed, unemployed or receiving disability?: Unemployed  Risk to self with the past 6 months Suicidal Ideation: No Has patient been a risk to self within the past 6 months prior to admission? : Other (comment)(Son reports her to be ) Suicidal Intent: No Has patient had any suicidal intent within the past 6 months prior to admission? : No Is patient at risk for suicide?: No Suicidal Plan?: No Has patient had any suicidal plan within the past 6 months prior to admission? : No Access to Means: No What has been your use of drugs/alcohol within the last 12 months?: None reported  Previous Attempts/Gestures: No How many times?: 0 Other Self Harm Risks: None reported  Triggers for Past Attempts: None  known Intentional Self Injurious Behavior: None Family Suicide History: No Recent stressful life event(s): Conflict (Comment)(at home ) Persecutory voices/beliefs?: No(Son reports yes) Depression: No Depression Symptoms: (Pt denies) Substance abuse history and/or treatment for substance abuse?: No Suicide prevention information given to non-admitted patients: Yes  Risk to Others within the past 6 months Homicidal Ideation: No Does patient have any lifetime risk of violence toward others beyond the six months prior to admission? 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 : Loss Amount of the weight change? (lbs): 50 lbs(last few weeks ) Sleep: Decreased Total Hours of Sleep: 5(pt reports to only need 5 hr of sleep) Vegetative Symptoms:  None  ADLScreening [facility] Assessment Services) Patient's cognitive ability adequate to safely complete daily activities?: Yes Patient able to express need for assistance with ADLs?: Yes Independently performs ADLs?: Yes (appropriate for developmental age)  Prior Inpatient Therapy Prior Inpatient Therapy: Yes Prior Therapy Dates: 2019 Prior Therapy Facilty/Provider(s): Hospital in [place](Pt denied, son provided info but unsure of name ) Reason for Treatment: unknown   Prior Outpatient Therapy Prior Outpatient Therapy: No Does patient have an ACCT team?: No Does patient have Intensive In-House Services?  : No Does patient have [facility] services? : No Does patient have P4CC services?: No  ADL Screening (condition at time of admission) Patient's cognitive ability adequate to safely complete daily activities?: Yes Is the patient deaf or have difficulty hearing?: No Does the patient have difficulty seeing, even when wearing glasses/contacts?: No Does the patient have difficulty concentrating, remembering, or making decisions?: No Patient able to express need for assistance with ADLs?: Yes Does the patient have difficulty dressing or bathing?: No Independently performs ADLs?: Yes (appropriate for developmental age) Does the patient have difficulty walking or climbing stairs?: No Weakness of Legs: None Weakness of Arms/Hands: None  Home Assistive Devices/Equipment Home Assistive Devices/Equipment: None  Therapy Consults (therapy consults require a physician order) PT Evaluation Needed: No OT Evalulation Needed: No SLP Evaluation Needed: No Abuse/Neglect Assessment (Assessment to be complete while patient is alone) Abuse/Neglect Assessment Can Be Completed: Yes Physical Abuse: Denies Verbal Abuse: Denies Sexual Abuse: Denies Exploitation of patient/patient's resources: Denies Self-Neglect: Denies Values / Beliefs Cultural Requests During Hospitalization: None Spiritual Requests During  Hospitalization: None Consults Spiritual Care Consult Needed: No Transition of Care Team Consult Needed: No            Disposition:  Disposition Initial Assessment Completed for this Encounter: Yes Patient referred to: Other (Comment)  On Site Evaluation by:   Reviewed with Physician:    Opal Sidles 10/26/2019 2:03 PM

## 2019-10-26 NOTE — Consult Note (Signed)
Erin Orr   Reason for Orr:  Psych evaluation  Referring Physician:  Dr. Darl Householder Patient Identification: Erin Orr MRN:  [id number] Principal Diagnosis: Delusion [facility]) Diagnosis:  Principal Problem:   Delusion ([place])   Total Time spent with patient: 45 minutes  Subjective:   Erin Orr is a 59 y.o. female patient admitted per er-nurse:  Pt arrives via sheriff who states pt's husband took out papers on her because she has not been eating, does not think that he is her husband even though they have been married for 27 years and is concerned for her safety. [name]&O to person and place but is unsure why she is here. Pt reports she has not been eating because "I dont trust them and my food are diifferent, I like bacon and things of that sort". Pt calm and cooperative during triage.   HPI:  Erin Orr, 59 y.o., female patient seen via telepsych by this provider; chart reviewed and consulted with Dr. Dwyane Dee on 10/26/19.  On evaluation Erin Orr reports per EDP: Erin Orr is a 59 y.o. female history of hypertension, schizophrenia who presenting with altered mental status, confusion.  Per the husband, patient has been progressively more agitated and confused.  Patient has been not recognizing her husband.  She thought that he is an impostor.  She also has not been eating.  She stopped going to the grocery store because she was concerned that the store has things that are not made from the [place].  Patient also has been hallucinating. She thinks she is an angel to save everyone from hell.  She is depressed because she is unable to save everyone.  He states that she was hospitalized for psychiatric breakdown in [place] previously. She is IVC by her husband   During evaluation Erin Orr is laying on the bed; she is alert/oriented x 3; calm/cooperative; and mood incongruent with affect. Her affect is flat and  blunted.  Her speech at times tangential. Patient is speaking in a clear tone at moderate volume, and monotone  pace; with fair eye contact. Her thought process is goes in and out of coherecy and relevancy; There is no indication that she is currently responding to internal/external stimuli. However she is experiencing delusional thought content.  Patient denies suicidal/self-harm/homicidal ideation, psychosis, and paranoia.  Patient has remained calm throughout assessment. Patient does not appear to be a threat to herself or others.    Past Psychiatric History: no  Risk to Self:  no Risk to Others:  no Prior Inpatient Therapy:  no Prior Outpatient Therapy:  no  Past Medical History:  Past Medical History:  Diagnosis Date  . Hypertension    History reviewed. No pertinent surgical history. Family History:  Family History  Problem Relation Age of Onset  . Heart disease Brother        has 3 brothers  . Heart attack Brother   . Colon cancer Neg Hx   . Breast cancer Neg Hx    Family Psychiatric  History: unknown Social History:  Social History   Substance and Sexual Activity  Alcohol Use Never     Social History   Substance and Sexual Activity  Drug Use Never    Social History   Socioeconomic History  . Marital status: Married    Spouse name: Not on file  . Number of children: Not on file  . Years of education: Not on file  . Highest education level:  Not on file  Occupational History  . Not on file  Tobacco Use  . Smoking status: Never Smoker  . Smokeless tobacco: Never Used  Substance and Sexual Activity  . Alcohol use: Never  . Drug use: Never  . Sexual activity: Not on file  Other Topics Concern  . 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 Difficulty of Paying Living Expenses:   Food Insecurity:   . Worried About Programme researcher, broadcasting/film/videounning Out of Food in the Last Year:    . Baristaan Out of Food in the Last Year:   Transportation Needs:   . Freight forwarderLack of Transportation (Medical):   Marland Kitchen. Lack of Transportation (Non-Medical):   Physical Activity:   . Days of Exercise per Week:   . Minutes of Exercise per Session:   Stress:   . Feeling of Stress :   Social Connections:   . Frequency of Communication with Friends and Family:   . Frequency of Social Gatherings with Friends and Family:   . Attends Religious Services:   . Active Member of Clubs or Organizations:   . Attends BankerClub or Organization Meetings:   Marland Kitchen. Marital Status:    Additional Social History:    Allergies:  No Known Allergies  Labs:  Results for orders placed or performed during the hospital encounter of 10/25/19 (from the past 48 hour(s))  Urine Drug Screen, Qualitative     Status: None   Collection Time: 10/25/19  3:40 PM  Result Value Ref Range   Tricyclic, Ur Screen NONE DETECTED NONE DETECTED   Amphetamines, Ur Screen NONE DETECTED NONE DETECTED   MDMA (Ecstasy)Ur Screen NONE DETECTED NONE DETECTED   Cocaine Metabolite,Ur [place] NONE DETECTED NONE DETECTED   Opiate, Ur Screen NONE DETECTED NONE DETECTED   Phencyclidine (PCP) Ur S NONE DETECTED NONE DETECTED   Cannabinoid 50 Ng, Ur [place] NONE DETECTED NONE DETECTED   Barbiturates, Ur Screen NONE DETECTED NONE DETECTED   Benzodiazepine, Ur Scrn NONE DETECTED NONE DETECTED   Methadone Scn, Ur NONE DETECTED NONE DETECTED    Comment: (NOTE) Tricyclics + metabolites, urine    Cutoff 1000 ng/mL Amphetamines + metabolites, urine  Cutoff 1000 ng/mL MDMA (Ecstasy), urine              Cutoff 500 ng/mL Cocaine Metabolite, urine          Cutoff 300 ng/mL Opiate + metabolites, urine        Cutoff 300 ng/mL Phencyclidine (PCP), urine         Cutoff 25 ng/mL Cannabinoid, urine                 Cutoff 50 ng/mL Barbiturates + metabolites, urine  Cutoff 200 ng/mL Benzodiazepine, urine              Cutoff 200 ng/mL Methadone, urine                   Cutoff 300  ng/mL The urine drug screen provides only a preliminary, unconfirmed analytical test result and should not be used for non-medical purposes. Clinical consideration and professional judgment should be applied to any positive drug screen result due to possible interfering substances. A more specific alternate chemical method must be used in order to obtain a confirmed analytical result. Gas chromatography / mass spectrometry (GC/MS) is the preferred confirmat ory method. Performed  at [facility], [address] [place], [place] [address]   Comprehensive metabolic panel     Status: Abnormal   Collection Time: 10/25/19  3:42 PM  Result Value Ref Range   Sodium 140 135 - 145 mmol/L   Potassium 4.1 3.5 - 5.1 mmol/L   Chloride 103 98 - 111 mmol/L   CO2 28 22 - 32 mmol/L   Glucose, Bld 95 70 - 99 mg/dL    Comment: Glucose reference range applies only to samples taken after fasting for at least 8 hours.   BUN 9 6 - 20 mg/dL   Creatinine, Ser 2.59 0.44 - 1.00 mg/dL   Calcium 56.3 (H) 8.9 - 10.3 mg/dL   Total Protein 9.0 (H) 6.5 - 8.1 g/dL   Albumin 4.7 3.5 - 5.0 g/dL   AST 18 15 - 41 U/L   ALT 12 0 - 44 U/L   Alkaline Phosphatase 141 (H) 38 - 126 U/L   Total Bilirubin 1.3 (H) 0.3 - 1.2 mg/dL   GFR calc non Af Amer >60 >60 mL/min   GFR calc Af Amer >60 >60 mL/min   Anion gap 9 5 - 15    Comment: Performed at [facility], [address] [place], [place] [address]  Ethanol     Status: None   Collection Time: 10/25/19  3:42 PM  Result Value Ref Range   Alcohol, Ethyl (B) <10 <10 mg/dL    Comment: (NOTE) Lowest detectable limit for serum alcohol is 10 mg/dL. For medical purposes only. Performed at [facility], [address] [place], [place] [address]   Salicylate level     Status: Abnormal   Collection Time: 10/25/19  3:42 PM  Result Value Ref Range   Salicylate Lvl <7.0 (L) 7.0 - 30.0 mg/dL    Comment: Performed at [facility], [address] [place], [place] [address]  Acetaminophen level     Status: Abnormal   Collection Time: 10/25/19  3:42 PM  Result Value Ref Range   Acetaminophen (Tylenol), Serum <10 (L) 10 - 30 ug/mL    Comment: (NOTE) Therapeutic concentrations vary significantly. A range of 10-30 ug/mL  may be an effective concentration for many patients. However, some  are best treated at concentrations outside of this range. Acetaminophen concentrations >150 ug/mL at 4 hours after ingestion  and >50 ug/mL at 12 hours after ingestion are often associated with  toxic reactions. Performed at [facility], [address] [place], [place] [address]   CBC     Status: None   Collection Time: 10/25/19  3:42 PM  Result Value Ref Range   WBC 5.8 4.0 - 10.5 K/uL   RBC 5.04 3.87 - 5.11 MIL/uL   Hemoglobin 14.3 12.0 - 15.0 g/dL   HCT 01.6 01.0 - 93.2 %   MCV 89.1 80.0 - 100.0 fL   MCH 28.4 26.0 - 34.0 pg   MCHC 31.8 30.0 - 36.0 g/dL   RDW 35.5 73.2 - 20.2 %   Platelets 303 150 - 400 K/uL   nRBC 0.0 0.0 - 0.2 %    Comment: Performed at [facility], [address] [place], [place] [address]  TSH     Status: None   Collection Time: 10/25/19  3:42 PM  Result Value Ref Range   TSH 0.816 0.350 - 4.500 uIU/mL    Comment: Performed by a 3rd Generation assay with a functional sensitivity of <=0.01 uIU/mL. Performed at [facility], [address] [place], [place] [address]   SARS Coronavirus 2 by RT PCR (hospital order, performed in [facility] hospital lab) Nasopharyngeal Nasopharyngeal Swab     Status: None   Collection Time: 10/25/19  7:33 PM   Specimen: Nasopharyngeal Swab  Result Value Ref Range   SARS Coronavirus 2 NEGATIVE NEGATIVE    Comment: (NOTE) SARS-CoV-2 target nucleic acids are NOT DETECTED. The SARS-CoV-2 RNA is generally detectable in upper and lower respiratory specimens during the acute phase of infection. The lowest concentration of SARS-CoV-2 viral copies  this assay can detect is 250 copies / mL. A negative result does not preclude SARS-CoV-2 infection and should not be used as the sole basis for treatment or other patient management decisions.  A negative result may occur with improper specimen collection / handling, submission of specimen other than nasopharyngeal swab, presence of viral mutation(s) within the areas targeted by this assay, and inadequate number of viral copies (<250 copies / mL). A negative result must be combined with clinical observations, patient history, and epidemiological information. Fact Sheet for Patients:   BoilerBrush.com.cy Fact Sheet for Healthcare Providers: https://pope.com/ This test is not yet approved or cleared  by the [place] FDA and has been authorized for detection and/or diagnosis of SARS-CoV-2 by FDA under an Emergency Use Authorization (EUA).  This EUA will remain in effect (meaning this test can be used) for the duration of the COVID-19 declaration under Section 564(b)(1) of the Act, 21 U.S.C. section 360bbb-3(b)(1), unless the authorization is terminated or revoked sooner. Performed at [facility], [address], [place], [place] [address]     Current Facility-Administered Medications  Medication Dose Route Frequency Provider Last Rate Last Admin  . amLODipine (NORVASC) tablet 5 mg  5 mg Oral Daily Charlynne Pander, MD   5 mg at 10/25/19 1951  . gabapentin (NEURONTIN) capsule 100 mg  100 mg Oral QHS Charlynne Pander, MD      . LORazepam (ATIVAN) tablet 1 mg  1 mg Oral Q4H PRN Charlynne Pander, MD      . metoprolol succinate (TOPROL-XL) 24 hr tablet 100 mg  100 mg Oral Daily Charlynne Pander, MD       Current Outpatient Medications  Medication Sig Dispense Refill  . amLODipine (NORVASC) 5 MG tablet Take 1 tablet (5 mg total) by mouth daily. 90 tablet 3  . gabapentin (NEURONTIN) 100 MG capsule Take one tablet ( 100  mg) by mouth one hour before bedtime every night. 30 capsule 1  . metoprolol succinate (TOPROL-XL) 100 MG 24 hr tablet Take 1 tablet (100 mg total) by mouth daily. 60 tablet 3    Musculoskeletal: Strength & Muscle Tone: within normal limits Gait & Station: unsteady Patient leans: N/A  Psychiatric Specialty Exam: Physical Exam  Nursing note and vitals reviewed. Constitutional: She is oriented to person, place, and time. She appears well-developed.  HENT:  Head: Normocephalic.  Eyes: Pupils are equal, round, and reactive to light.  Respiratory: Effort normal.  Musculoskeletal:        General: Normal range of motion.     Cervical back: Normal range of motion.  Neurological: She is alert and oriented to person, place, and time.  Skin: Skin is warm and dry.  Psychiatric: Her speech is normal. Her mood appears anxious. Her affect is inappropriate. She is withdrawn.    Review of Systems  Blood pressure (!) 156/99, pulse 83, temperature 98.3 F (36.8 C), temperature source Oral,  resp. rate 18, height 5\' 6"  (1.676 m), weight 90.7 kg, SpO2 98 %.Body mass index is 32.28 kg/m.  General Appearance: Casual  Eye Contact:  Fair  Speech:  Normal Rate  Volume:  Normal  Mood:  Euphoric  Affect:  Blunt and Flat  Thought Process:  Disorganized and Descriptions of Associations: Intact  Orientation:  Full (Time, Place, and Person)  Thought Content:  Illogical  Suicidal Thoughts:  No  Homicidal Thoughts:  No  Memory:  Recent;   Fair  Judgement:  Intact  Insight:  Lacking  Psychomotor Activity:  Normal  Concentration:  Attention Span: Fair  Recall:  of Knowledge:  Fair  Language:  Fair  Akathisia:  NA  Handed:  Right  AIMS (if indicated):     Assets:  Social Support  ADL's:  Intact  Cognition:  Impaired,  Mild  Sleep:        Treatment Plan Summary: Plan reassess in the am and a possible neuro Orr, as pt has not psych hx  Disposition: No evidence of imminent risk to  self or others at present.   Supportive therapy provided about ongoing stressors. neuro Orr in the am  [employer], NP 10/26/2019 4:51 AM

## 2019-10-26 NOTE — Consult Note (Signed)
  Patient assessed and case reviewed by this Clinical research associate.  At this time patient continues to meet inpatient criteria.  Upon evaluation patient is observed to be sitting in the room chair, and presents very well.  Patient continues to endorse some ongoing paranoia."  We will give me a minute.  I may have a little secret."  Patient expressed some concerns regarding her husband and his intent to have her brought into the hospital.  She denies the behaviors that have been discussed in the IVC that include not eating, not sleeping, and confusion.  However per nursing staff patient has not slept all night, she is not eating.  Nursing reports patient open juice cup and drank a little bit of juice however would not open her food.  She also does consumed a small amount of water to take her medication.  Her UDS and urinalysis were both negative.  EKG ordered by this writer determined to be in normal sinus rhythm, QTC of 419.  Patient will unknown psychiatric history, and remaining a poor historian at this time will start low-dose antipsychotic medication.  Orders placed for Seroquel 25 mg p.o. twice daily for acute psychosis, irritability, and ongoing paranoia.  Will place order for Benadryl 25 mg p.o. nightly as needed for insomnia. Patient under review at [facility].

## 2019-10-26 NOTE — ED Notes (Signed)
Pt denies SI/HI/AVH at assessment. When asked how she came to be here she stated "I was sent to help people but I didn't tell them". Pt wants to know what meds she is taking and what they are for. Pt informed that those were her blood pressure pills and she gladly took them. Pt sits in chair in room although advised that she can lay on the bed since it appears she has not slept all night. Occasionally noted to be dozing on chair.

## 2019-10-27 ENCOUNTER — Encounter: Payer: Self-pay | Admitting: Psychiatric/Mental Health

## 2019-10-27 ENCOUNTER — Other Ambulatory Visit: Payer: Self-pay

## 2019-10-27 DIAGNOSIS — M5416 Radiculopathy, lumbar region: Secondary | ICD-10-CM

## 2019-10-27 DIAGNOSIS — I10 Essential (primary) hypertension: Secondary | ICD-10-CM

## 2019-10-27 DIAGNOSIS — F22 Delusional disorders: Secondary | ICD-10-CM

## 2019-10-27 DIAGNOSIS — R232 Flushing: Secondary | ICD-10-CM

## 2019-10-27 DIAGNOSIS — F23 Brief psychotic disorder: Secondary | ICD-10-CM

## 2019-10-27 MED ORDER — GABAPENTIN 100 MG PO CAPS
100.0000 mg | ORAL_CAPSULE | Freq: Two times a day (BID) | ORAL | Status: DC | PRN
  Administered 2019-10-27: 100 mg via ORAL
  Filled 2019-10-27: qty 1

## 2019-10-27 NOTE — BHH Counselor (Signed)
Adult Comprehensive Assessment  Patient ID: Erin Orr, female   DOB: 22-Apr-1961, 59 y.o.   MRN: [id number]  Information Source: Information source: Patient  Current Stressors:  Patient states their primary concerns and needs for treatment are:: "an argument with my husband" Patient states their goals for this hospitilization and ongoing recovery are:: "I'm fine" Educational / Learning stressors: none reported Employment / Job issues: none reported Family Relationships: "finePublishing copy / Lack of resources (include bankruptcy): unemployment Housing / Lack of housing: stable Physical health (include injuries & life threatening diseases): HTN Social relationships: "I have friends" Substance abuse: pt denies use Bereavement / Loss: none reported  Living/Environment/Situation:  Living Arrangements: Spouse/significant other Who else lives in the home?: husband How [place] has patient lived in current situation?: 27 years What is atmosphere in current home: Chaotic  Family History:  Marital status: Separated Separated, when?: 10/24/19 What types of issues is patient dealing with in the relationship?: miscommunication Are you sexually active?: No What is your sexual orientation?: heterosexual Does patient have children?: Yes How many children?: 2 How is patient's relationship with their children?: pt reports she has a good relationship with her children  Childhood History:  By whom was/is the patient raised?: Other (Comment)("I rather not say") Description of patient's relationship with caregiver when they were a child: "I rather not say" Patient's description of current relationship with people who raised him/her: "I rather not say" How were you disciplined when you got in trouble as a child/adolescent?: "I rather not say" Does patient have siblings?: Yes Number of Siblings: 3 Description of patient's current relationship with siblings: pt reports she has 3 brothers and  they all have a good relationship Did patient suffer any verbal/emotional/physical/sexual abuse as a child?: No Did patient suffer from severe childhood neglect?: No Has patient ever been sexually abused/assaulted/raped as an adolescent or adult?: No Was the patient ever a victim of a crime or a disaster?: No Witnessed domestic violence?: No Has patient been effected by domestic violence as an adult?: No  Education:  Highest grade of school patient has completed: 3 years of college Currently a student?: No Learning disability?: No  Employment/Work Situation:   Employment situation: Unemployed Patient's job has been impacted by current illness: No What is the longest time patient has a held a job?: 20 years 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 Did You Receive Any Psychiatric Treatment/Services While in the [employer]?: No Are There Guns or Other Weapons in [place]?: No  Financial Resources:   Museum/gallery curator resources: Marine scientist unemployment Does patient have a Programmer, applications or guardian?: No  Alcohol/Substance Abuse:   What has been your use of drugs/alcohol within the last 12 months?: pt denies If attempted suicide, did drugs/alcohol play a role in this?: No Alcohol/Substance Abuse Treatment Hx: Denies past history Has alcohol/substance abuse ever caused legal problems?: No  Social Support System:   Pensions consultant Support System: Fair Astronomer System: family Type of faith/religion: none reported How does patient's faith help to cope with current illness?: none reported  Leisure/Recreation:   Leisure and Hobbies: reading, running, beach  Strengths/Needs:   What is the patient's perception of their strengths?: "I am a people's person ,and I love to cook" Patient states they can use these personal strengths during their treatment to contribute to their recovery: "I don't know " Patient states these barriers may  affect/interfere with their treatment: none reported Patient states these barriers may affect  their return to the community: none reported  Discharge Plan:   Currently receiving community mental health services: No Patient states concerns and preferences for aftercare planning are: Member reports she does not want to do op services or med mgt. Patient states they will know when they are safe and ready for discharge when: "at any time" Does patient have access to transportation?: Yes(son) Does patient have financial barriers related to discharge medications?: No Will patient be returning to same living situation after discharge?: Yes  Summary/Recommendations:   Summary and Recommendations (to be completed by the evaluator): Pt is a 59 year old female, involuntary admission, due to endorsing psychosis. Member has been diagnosed with schizophrenia. Patient has a history of hypertension, schizophrenia who presenting with altered mental status, confusion.  Per the husband, patient has been progressively more agitated and confused.  Patient has been not recognizing her husband. Recommendations include: crisis stabilization, therapeutic milieu, encourage group attendance and participation, medication management for detox/mood stabilization and development of comprehensive mental wellness/sobriety plan.  Erin Orr  CUEBAS-COLON, LCSW   10/27/2019

## 2019-10-27 NOTE — [facility] Suicide Risk Assessment (Signed)
[facility] Admission Suicide Risk Assessment   Nursing information obtained from:  Patient Demographic factors:  NA Current Mental Status:  NA Loss Factors:  NA Historical Factors:  NA Risk Reduction Factors:  Sense of responsibility to family, Living with another person, especially a relative, Positive social support  Total Time spent with patient: 1.5 hours Principal Problem: Acute psychosis (HCC) Diagnosis:  Principal Problem:   Acute psychosis (HCC) Active Problems:   Hypertension   Lumbar radiculitis   Hot flashes   Delusion (HCC)  Subjective Data:  "I wasn't feeling right about my body, and his attitude was changing".  Continued Clinical Symptoms:  Alcohol Use Disorder Identification Test Final Score (AUDIT): 0 The "Alcohol Use Disorders Identification Test", Guidelines for Use in Primary Care, Second Edition.  World Science writer [facility]). Score between 0-7:  no or low risk or alcohol related problems. Score between 8-15:  moderate risk of alcohol related problems. Score between 16-19:  high risk of alcohol related problems. Score 20 or above:  warrants further diagnostic evaluation for alcohol dependence and treatment.   CLINICAL FACTORS:   Severe Anxiety and/or Agitation Currently Psychotic Unstable or Poor Therapeutic Relationship Previous Psychiatric Diagnoses and Treatments Medical Diagnoses and Treatments/Surgeries    Musculoskeletal: Strength & Muscle Tone: within normal limits Gait & Station: normal Patient leans: N/A  Psychiatric Specialty Exam: Physical Exam  Nursing note and vitals reviewed. Constitutional: She is oriented to person, place, and time. She appears well-developed and well-nourished. No distress.  HENT:  Head: Normocephalic and atraumatic.  Eyes: EOM are normal.  Cardiovascular: Normal rate.  Respiratory: Effort normal. No respiratory distress.  Musculoskeletal:        General: Normal range of motion.     Cervical back: Normal range  of motion.  Neurological: She is alert and oriented to person, place, and time.  Skin: No rash noted. No erythema.    Review of Systems  Constitutional: Negative.   HENT: Negative.   Respiratory: Negative.   Cardiovascular: Negative.   Gastrointestinal: Negative.   Musculoskeletal: Negative.   Neurological: Negative.   Psychiatric/Behavioral: Negative for agitation, behavioral problems, confusion, decreased concentration, dysphoric mood, hallucinations, self-injury, sleep disturbance and suicidal ideas. The patient is not nervous/anxious and is not hyperactive.     Blood pressure (!) 146/91, pulse 100, temperature 99.3 F (37.4 C), temperature source Oral, resp. rate 16, height 5\' 6"  (1.676 m), weight 86.2 kg, SpO2 100 %.Body mass index is 30.67 kg/m.  General Appearance: Casual and Fairly Groomed  Eye Contact:  Good  Speech:  Clear and Coherent  Volume:  Normal  Mood:  Euthymic  Affect:  Calm  Thought Process:  Coherent and Descriptions of Associations: Circumstantial  Orientation:  Full (Time, Place, and Person)  Thought Content:  Logical, Delusions, Hallucinations: None and Paranoid Ideation  Suicidal Thoughts:  No  Homicidal Thoughts:  No  Memory:  Immediate;   Good Recent;   Good Remote;   Good  Judgement:  Good  Insight:  Fair  Psychomotor Activity:  Normal  Concentration:  Concentration: Good and Attention Span: Good  Recall:  Good  Fund of Knowledge:  Good  Language:  Good  Akathisia:  Negative  Handed:  Right  AIMS (if indicated):     Assets:  Communication Skills Desire for Improvement Financial Resources/Insurance Housing Social Support  ADL's:  Intact  Cognition:  WNL  Sleep:   reports she slept "great"      COGNITIVE FEATURES THAT CONTRIBUTE TO RISK:  Closed-mindedness  SUICIDE RISK:   Mild:  Suicidal ideation of limited frequency, intensity, duration, and specificity.  There are no identifiable plans, no associated intent, mild dysphoria and  related symptoms, good self-control (both objective and subjective assessment), few other risk factors, and identifiable protective factors, including available and accessible social support.  PLAN OF CARE:   Treatment Plan Summary: Daily contact with patient to assess and evaluate symptoms and progress in treatment and Medication management  Observation Level/Precautions:  [place] 15 minute checks  Laboratory:  Reviewed from emergency department   Psychotherapy: Attend groups  Medications: Seroquel 25 mg at bedtime, continue home antihypertensive medications  Consultations: None  Discharge Concerns: Family strife  Estimated LOS: 5 days  Other: Collateral to be obtained from other family members.   Physician Treatment Plan for Primary Diagnosis: Acute psychosis ([place]) [place] Term Goal(s): Improvement in symptoms so as ready for discharge  Short Term Goals: Ability to identify changes in lifestyle to reduce recurrence of condition will improve, Ability to identify and develop effective coping behaviors will improve, Ability to maintain clinical measurements within normal limits will improve and Compliance with prescribed medications will improve  Physician Treatment Plan for Secondary Diagnosis: Principal Problem:   Acute psychosis ([place]) Active Problems:   Hypertension   Lumbar radiculitis   Hot flashes   Delusion ([place])  [place] Term Goal(s): Improvement in symptoms so as ready for discharge  Short Term Goals: Ability to identify changes in lifestyle to reduce recurrence of condition will improve, Ability to verbalize feelings will improve, Ability to identify and develop effective coping behaviors will improve, Ability to maintain clinical measurements within normal limits will improve and Compliance with prescribed medications will improve   I certify that inpatient services furnished can reasonably be expected to improve the patient's condition.   [name],  MD 10/27/2019, 10:20 AM

## 2019-10-27 NOTE — Tx Team (Signed)
Initial Treatment Plan 10/27/2019 5:46 AM Erin Orr QVO:[id number]    PATIENT STRESSORS: Marital or family conflict Other: Paranoia, mistrust of others   PATIENT STRENGTHS: Active sense of humor Physical Health Supportive family/friends   PATIENT IDENTIFIED PROBLEMS: Marital issues / perceived endangerment                     DISCHARGE CRITERIA:  Improved stabilization in mood, thinking, and/or behavior Need for constant or close observation no longer present Verbal commitment to aftercare and medication compliance  PRELIMINARY DISCHARGE PLAN: Return to previous living arrangement  PATIENT/FAMILY INVOLVEMENT: This treatment plan has been presented to and reviewed with the patient, Erin Orr. The patient has been given the opportunity to ask questions and make suggestions.  Tab Rylee L Butler-Nicholson, LPN 07/30/9808, 2:54 AM

## 2019-10-27 NOTE — Plan of Care (Signed)
D- Patient alert and oriented. Patient presented in a pleasant mood on assessment stating that she slept good last night and had no complaints to voice to this Clinical research associate. Patient denied SI, HI, AVH, and pain at this time. Patient also denied any signs/symptoms of depression/anxiety, reporting that overall, she is feeling "excellent". Patient had no stated goals for today.  A- Majority of scheduled medications administered to patient, per MD orders. Support and encouragement provided.  Routine safety checks conducted every 15 minutes.  Patient informed to notify staff with problems or concerns.  R- No adverse drug reactions noted. Patient contracts for safety at this time. Patient compliant with medications and treatment plan. Patient receptive, calm, and cooperative. Patient interacts well with others on the unit.  Patient remains safe at this time.  Problem: Education: Goal: Knowledge of [facility] Education information/materials will improve 10/27/2019 1524 by Doyce Para, RN Outcome: Progressing 10/27/2019 1238 by Doyce Para, RN Outcome: Not Progressing Goal: Emotional status will improve 10/27/2019 1524 by Doyce Para, RN Outcome: Progressing 10/27/2019 1238 by Doyce Para, RN Outcome: Not Progressing Goal: Mental status will improve 10/27/2019 1524 by Doyce Para, RN Outcome: Progressing 10/27/2019 1238 by Doyce Para, RN Outcome: Not Progressing Goal: Verbalization of understanding the information provided will improve 10/27/2019 1524 by Doyce Para, RN Outcome: Progressing 10/27/2019 1238 by Doyce Para, RN Outcome: Not Progressing   Problem: Activity: Goal: Interest or engagement in activities will improve 10/27/2019 1524 by Doyce Para, RN Outcome: Progressing 10/27/2019 1238 by Doyce Para, RN Outcome: Not Progressing Goal: Sleeping patterns will improve 10/27/2019 1524 by Doyce Para, RN Outcome:  Progressing 10/27/2019 1238 by Doyce Para, RN Outcome: Not Progressing   Problem: Coping: Goal: Ability to verbalize frustrations and anger appropriately will improve 10/27/2019 1524 by Doyce Para, RN Outcome: Progressing 10/27/2019 1238 by Doyce Para, RN Outcome: Not Progressing Goal: Ability to demonstrate self-control will improve 10/27/2019 1524 by Doyce Para, RN Outcome: Progressing 10/27/2019 1238 by Doyce Para, RN Outcome: Not Progressing   Problem: Health Behavior/Discharge Planning: Goal: Identification of resources available to assist in meeting health care needs will improve 10/27/2019 1524 by Doyce Para, RN Outcome: Progressing 10/27/2019 1238 by Doyce Para, RN Outcome: Not Progressing Goal: Compliance with treatment plan for underlying cause of condition will improve 10/27/2019 1524 by Doyce Para, RN Outcome: Progressing 10/27/2019 1238 by Doyce Para, RN Outcome: Not Progressing   Problem: Physical Regulation: Goal: Ability to maintain clinical measurements within normal limits will improve 10/27/2019 1524 by Doyce Para, RN Outcome: Progressing 10/27/2019 1238 by Doyce Para, RN Outcome: Not Progressing   Problem: Safety: Goal: Periods of time without injury will increase 10/27/2019 1524 by Doyce Para, RN Outcome: Progressing 10/27/2019 1238 by Doyce Para, RN Outcome: Not Progressing   Problem: Activity: Goal: Will verbalize the importance of balancing activity with adequate rest periods 10/27/2019 1524 by Doyce Para, RN Outcome: Progressing 10/27/2019 1238 by Doyce Para, RN Outcome: Not Progressing   Problem: Education: Goal: Will be free of psychotic symptoms 10/27/2019 1524 by Doyce Para, RN Outcome: Progressing 10/27/2019 1238 by Doyce Para, RN Outcome: Not Progressing Goal: Knowledge of the prescribed therapeutic regimen will  improve 10/27/2019 1524 by Doyce Para, RN Outcome: Progressing 10/27/2019 1238 by Doyce Para, RN Outcome: Not Progressing   Problem: Coping: Goal: Coping ability will improve 10/27/2019 1524 by Doyce Para, RN Outcome: Progressing 10/27/2019 1238 by Doyce Para, RN Outcome: Not Progressing Goal: Will verbalize feelings 10/27/2019 1524 by Doyce Para, RN Outcome: Progressing 10/27/2019 1238 by Donnetta Hutching,  Trigo Winterbottom, RN Outcome: Not Progressing   Problem: Health Behavior/Discharge Planning: Goal: Compliance with prescribed medication regimen will improve 10/27/2019 1524 by Lyda Kalata, RN Outcome: Progressing 10/27/2019 1238 by Lyda Kalata, RN Outcome: Not Progressing   Problem: Nutritional: Goal: Ability to achieve adequate nutritional intake will improve 10/27/2019 1524 by Lyda Kalata, RN Outcome: Progressing 10/27/2019 1238 by Lyda Kalata, RN Outcome: Not Progressing   Problem: Role Relationship: Goal: Ability to communicate needs accurately will improve 10/27/2019 1524 by Lyda Kalata, RN Outcome: Progressing 10/27/2019 1238 by Lyda Kalata, RN Outcome: Not Progressing Goal: Ability to interact with others will improve 10/27/2019 1524 by Lyda Kalata, RN Outcome: Progressing 10/27/2019 1238 by Lyda Kalata, RN Outcome: Not Progressing   Problem: Safety: Goal: Ability to redirect hostility and anger into socially appropriate behaviors will improve 10/27/2019 1524 by Lyda Kalata, RN Outcome: Progressing 10/27/2019 1238 by Lyda Kalata, RN Outcome: Not Progressing Goal: Ability to remain free from injury will improve 10/27/2019 1524 by Lyda Kalata, RN Outcome: Progressing 10/27/2019 1238 by Lyda Kalata, RN Outcome: Not Progressing   Problem: Self-Care: Goal: Ability to participate in self-care as condition permits will improve 10/27/2019 1524 by Lyda Kalata,  RN Outcome: Progressing 10/27/2019 1238 by Lyda Kalata, RN Outcome: Not Progressing   Problem: Self-Concept: Goal: Will verbalize positive feelings about self 10/27/2019 1524 by Lyda Kalata, RN Outcome: Progressing 10/27/2019 1238 by Lyda Kalata, RN Outcome: Not Progressing

## 2019-10-27 NOTE — Plan of Care (Signed)
Patient newly admitted to the unit.  Problem: Education: Goal: Knowledge of [facility] Education information/materials will improve Outcome: Not Progressing Goal: Emotional status will improve Outcome: Not Progressing Goal: Mental status will improve Outcome: Not Progressing Goal: Verbalization of understanding the information provided will improve Outcome: Not Progressing   Problem: Activity: Goal: Interest or engagement in activities will improve Outcome: Not Progressing Goal: Sleeping patterns will improve Outcome: Not Progressing   Problem: Coping: Goal: Ability to verbalize frustrations and anger appropriately will improve Outcome: Not Progressing Goal: Ability to demonstrate self-control will improve Outcome: Not Progressing   Problem: Health Behavior/Discharge Planning: Goal: Identification of resources available to assist in meeting health care needs will improve Outcome: Not Progressing Goal: Compliance with treatment plan for underlying cause of condition will improve Outcome: Not Progressing   Problem: Physical Regulation: Goal: Ability to maintain clinical measurements within normal limits will improve Outcome: Not Progressing   Problem: Safety: Goal: Periods of time without injury will increase Outcome: Not Progressing   Problem: Activity: Goal: Will verbalize the importance of balancing activity with adequate rest periods Outcome: Not Progressing   Problem: Education: Goal: Will be free of psychotic symptoms Outcome: Not Progressing Goal: Knowledge of the prescribed therapeutic regimen will improve Outcome: Not Progressing   Problem: Coping: Goal: Coping ability will improve Outcome: Not Progressing Goal: Will verbalize feelings Outcome: Not Progressing   Problem: Health Behavior/Discharge Planning: Goal: Compliance with prescribed medication regimen will improve Outcome: Not Progressing   Problem: Nutritional: Goal: Ability to achieve  adequate nutritional intake will improve Outcome: Not Progressing   Problem: Role Relationship: Goal: Ability to communicate needs accurately will improve Outcome: Not Progressing Goal: Ability to interact with others will improve Outcome: Not Progressing   Problem: Safety: Goal: Ability to redirect hostility and anger into socially appropriate behaviors will improve Outcome: Not Progressing Goal: Ability to remain free from injury will improve Outcome: Not Progressing   Problem: Self-Care: Goal: Ability to participate in self-care as condition permits will improve Outcome: Not Progressing   Problem: Self-Concept: Goal: Will verbalize positive feelings about self Outcome: Not Progressing

## 2019-10-27 NOTE — BH Assessment (Signed)
[facility] Group Notes:  (Nursing/MHT/Case Management/Adjunct)  Date:  10/27/2019  Time:  9:00 PM  Type of Therapy:  Group Therapy  Participation Level:  Did Not Attend   Mayra Neer 10/27/2019, 9:00 PM

## 2019-10-27 NOTE — Progress Notes (Signed)
Patient decided to take her BP medication after talking to the MD this morning.

## 2019-10-27 NOTE — H&P (Addendum)
Psychiatric Admission Assessment Adult  Patient Identification: Erin Orr MRN:  [id number] Date of Evaluation:  10/27/2019 Chief Complaint:  Acute psychosis ([place]) [F23] Principal Diagnosis: Acute psychosis ([place]) Diagnosis:  Principal Problem:   Acute psychosis ([place]) Active Problems:   Hypertension   Lumbar radiculitis   Hot flashes   Delusion ([place])   CC:  "I wasn't feeling right about my body, and his attitude was changing".   History of Present Illness:  Erin Orr is a 59 y.o. female patient with unknown psychiatric history that arrived to the emergency department under IVC from her husband stating she did not recognize him, and was refusing to eat and drink. From initial psychiatric consultation: Pt arrives via sheriff who states pt's husband took out papers on her because she has not been eating, does not think that he is her husband even though they have been married for 27 years and is concerned for her safety. [name]&O to person and place but is unsure why she is here. Pt reports she has not been eating because "I dont trust them and my food are different, I like bacon and things of that sort". Pt calm and cooperative during triage. Patient seen via telepsych by this provider; chart reviewed and consulted with Dr. Dwyane Dee on 10/26/19.  On evaluation Erin Orr reports per EDP: Erin Orr a 59 y.o.femalehistory of hypertension,schizophrenia who presenting with altered mental status, confusion. Per the husband, patient has been progressively more agitated and confused. Patient has been not recognizing her husband. She thought that he is an impostor. She also has not been eating.She stopped going to the grocery store because she was concerned that the store has things that are not made from the [place]. Patient also has been hallucinating. Shethinks she is an Building services engineer to save everyone from hell. She is depressed because she is unable  to save everyone. He states that she was hospitalized for psychiatric breakdown in [place] previously. She is IVC by her husband During evaluation Erin Orr is laying on the bed; she is alert/oriented x 3; calm/cooperative; and mood incongruent with affect. Her affect is flat and blunted.  Her speech at times tangential. Patient is speaking in a clear tone at moderate volume, and monotone  pace; with fair eye contact. Her thought process is goes in and out of coherency and relevancy; There is no indication that she is currently responding to internal/external stimuli. However she is experiencing delusional thought content.  Patient denies suicidal/self-harm/homicidal ideation, psychosis, and paranoia. Patient has remained calm throughout assessment. Patient does not appear to be a threat to herself or others.     On evaluation today, patient is calm and cooperative with good eye contact.  She is alert and oriented x4, and denying suicidal ideation, homicidal ideation, or auditory or visual hallucinations.  She does endorse some paranoia towards her husband, and believes that he put her under involuntary commitment so that he can keep their house.  She describes that her husband, Britani Beattie, married for 27 years. She admits that she has felt disappointed by him for some time now, and states that he had an extramarital affair for 2 years where he, "left me."  She describes that he has since returned, but then told her he was leaving her 6 months ago.  She states that her- after my eating habits had changed, and she has "lost 100 pounds and feels great".-  She reports they have had disagreements and he has  been angry about her not buying meat at the grocery store, and her not wanting breakfast foods. She describes that they had an argument on Thursday after which he left for 3 hours and said that he wanted to be with her. "He left and came back as different person,"  She thought  his appearance was different, "his face seemed darker."  She felt he was a different person as his attitude was different.  She admits that she asked him for his ID and questioned him to confirm that he was her husband. She states that she asked him to tell him something about himself.  He stated that he liked music, which she knew.  She states that he told her about his friend Cecille Rubin (she did not know that friends name), and was surprised to hear about this friend that she had never heard of in the 27 years of her marriage.  This made her more paranoid that he was not her husband.  Patient describes that he then went to mow the lawn, and when he came back in she asked him for some time and space.  She states that he got angry and had a tantrum yelling and stomping on the porch that he was not going to lose his house.  She describes that they had been willed his parents house, but he ultimately lost it for uncertain financial reasons.  Erin Orr describes that she was in her living room on Friday 5/14 when the police came to her house to bring her to the hospital because her husband had called them because she was delusional and not eating.  She denies any symptoms.  She denies having any past psychiatric hospitalizations, to include a past hospitalization in [place], and believes that her husband has made these things up in order to get her committed. She reports now that she, "just wants to be free and have some space".  She states that she is unwilling to leave her house as well.  She is worried that her husband has Alzheimer's (91 years old), and has given consent for social work to contact her son, Philomena Doheny [phone] for whom she has just seen on Mother's Day.  Of note: Patient is being worked up for parathyroid disorder, but has not had follow-up with endocrinology. Calcium is slightly elevated, PTH is normal. Patient is confused regarding antihypertension medications, and had been  refusing medications.  Reviewed medical record and outpatient primary care notes with patient who states she is now agreeable to taking antihypertensives.  She states she will continue to refuse medications for sleep or antipsychotics.   Associated Signs/Symptoms: Depression Symptoms:  Denies (Hypo) Manic Symptoms:  Delusions, Anxiety Symptoms:  denies Psychotic Symptoms:  Delusions, Hallucinations: None PTSD Symptoms: Negative Total Time spent with patient: 1.5 hour  Past Psychiatric History: Denies  Is the patient at risk to self? No.  Has the patient been a risk to self in the past 6 months? No.  Has the patient been a risk to self within the distant past? No.  Is the patient a risk to others? No.  Has the patient been a risk to others in the past 6 months? No.  Has the patient been a risk to others within the distant past? No.   Prior Inpatient Therapy:   He has left her in the past for another woman 2 years ago for 2 years:  Per husband, she was hospitalized in [place] Prior Outpatient Therapy:  Denies  Alcohol Screening:  1. How often do you have a drink containing alcohol?: Never 2. How many drinks containing alcohol do you have on a typical day when you are drinking?: 1 or 2 3. How often do you have six or more drinks on one occasion?: Never AUDIT-C Score: 0 4. How often during the last year have you found that you were not able to stop drinking once you had started?: Never 5. How often during the last year have you failed to do what was normally expected from you because of drinking?: Never 6. How often during the last year have you needed a first drink in the morning to get yourself going after a heavy drinking session?: Never 7. How often during the last year have you had a feeling of guilt of remorse after drinking?: Never 8. How often during the last year have you been unable to remember what happened the night before because you had been drinking?: Never 9. Have you  or someone else been injured as a result of your drinking?: No 10. Has a relative or friend or a doctor or another health worker been concerned about your drinking or suggested you cut down?: No Alcohol Use Disorder Identification Test Final Score (AUDIT): 0 Alcohol Brief Interventions/Follow-up: AUDIT Score <7 follow-up not indicated Substance Abuse History in the last 12 months:  No. Consequences of Substance Abuse: NA Previous Psychotropic Medications: No  Psychological Evaluations: No  Past Medical History:  Past Medical History:  Diagnosis Date  . Hypertension    History reviewed. No pertinent surgical history. Family History:  Family History  Problem Relation Age of Onset  . Heart disease Brother        has 3 brothers  . Heart attack Brother   . Colon cancer Neg Hx   . Breast cancer Neg Hx    Family Psychiatric  History: None Tobacco Screening: Have you used any form of tobacco in the last 30 days? (Cigarettes, Smokeless Tobacco, Cigars, and/or Pipes): No Social History:  Social History   Substance and Sexual Activity  Alcohol Use Never     Social History   Substance and Sexual Activity  Drug Use Never    Additional Social History:  Married by 27 years     He has left her in the past for another woman 2 years ago for 2 years.   Husband is a Nutritional therapist.  Patient has been working at [employer], but has been laid off.  2 children that she has limited contact with (every couple of weeks).  -daughter 34 -son 42                   Allergies:  No Known Allergies Lab Results:  Results for orders placed or performed during the hospital encounter of 10/25/19 (from the past 48 hour(s))  Urine Drug Screen, Qualitative     Status: None   Collection Time: 10/25/19  3:40 PM  Result Value Ref Range   Tricyclic, Ur Screen NONE DETECTED NONE DETECTED   Amphetamines, Ur Screen NONE DETECTED NONE DETECTED   MDMA (Ecstasy)Ur Screen NONE DETECTED NONE DETECTED    Cocaine Metabolite,Ur [place] NONE DETECTED NONE DETECTED   Opiate, Ur Screen NONE DETECTED NONE DETECTED   Phencyclidine (PCP) Ur S NONE DETECTED NONE DETECTED   Cannabinoid 50 Ng, Ur  NONE DETECTED NONE DETECTED   Barbiturates, Ur Screen NONE DETECTED NONE DETECTED   Benzodiazepine, Ur Scrn NONE DETECTED NONE DETECTED   Methadone Scn, Ur NONE DETECTED NONE DETECTED  Comment: (NOTE) Tricyclics + metabolites, urine    Cutoff 1000 ng/mL Amphetamines + metabolites, urine  Cutoff 1000 ng/mL MDMA (Ecstasy), urine              Cutoff 500 ng/mL Cocaine Metabolite, urine          Cutoff 300 ng/mL Opiate + metabolites, urine        Cutoff 300 ng/mL Phencyclidine (PCP), urine         Cutoff 25 ng/mL Cannabinoid, urine                 Cutoff 50 ng/mL Barbiturates + metabolites, urine  Cutoff 200 ng/mL Benzodiazepine, urine              Cutoff 200 ng/mL Methadone, urine                   Cutoff 300 ng/mL The urine drug screen provides only a preliminary, unconfirmed analytical test result and should not be used for non-medical purposes. Clinical consideration and professional judgment should be applied to any positive drug screen result due to possible interfering substances. A more specific alternate chemical method must be used in order to obtain a confirmed analytical result. Gas chromatography / mass spectrometry (GC/MS) is the preferred confirmat ory method. Performed at [facility], [address] [place], [place] [address]   Comprehensive metabolic panel     Status: Abnormal   Collection Time: 10/25/19  3:42 PM  Result Value Ref Range   Sodium 140 135 - 145 mmol/L   Potassium 4.1 3.5 - 5.1 mmol/L   Chloride 103 98 - 111 mmol/L   CO2 28 22 - 32 mmol/L   Glucose, Bld 95 70 - 99 mg/dL    Comment: Glucose reference range applies only to samples taken after fasting for at least 8 hours.   BUN 9 6 - 20 mg/dL   Creatinine, Ser 6.040.68 0.44 - 1.00 mg/dL   Calcium 54.011.6 (H) 8.9 -  10.3 mg/dL   Total Protein 9.0 (H) 6.5 - 8.1 g/dL   Albumin 4.7 3.5 - 5.0 g/dL   AST 18 15 - 41 U/L   ALT 12 0 - 44 U/L   Alkaline Phosphatase 141 (H) 38 - 126 U/L   Total Bilirubin 1.3 (H) 0.3 - 1.2 mg/dL   GFR calc non Af Amer >60 >60 mL/min   GFR calc Af Amer >60 >60 mL/min   Anion gap 9 5 - 15    Comment: Performed at [facility], [address] [place], [place] [address]  Ethanol     Status: None   Collection Time: 10/25/19  3:42 PM  Result Value Ref Range   Alcohol, Ethyl (B) <10 <10 mg/dL    Comment: (NOTE) Lowest detectable limit for serum alcohol is 10 mg/dL. For medical purposes only. Performed at [facility], [address] [place], [place] [address]   Salicylate level     Status: Abnormal   Collection Time: 10/25/19  3:42 PM  Result Value Ref Range   Salicylate Lvl <7.0 (L) 7.0 - 30.0 mg/dL    Comment: Performed at [facility], [address] [place], [place] [address]  Acetaminophen level     Status: Abnormal   Collection Time: 10/25/19  3:42 PM  Result Value Ref Range   Acetaminophen (Tylenol), Serum <10 (L) 10 - 30 ug/mL    Comment: (NOTE) Therapeutic concentrations vary significantly. A range of 10-30 ug/mL  may be an effective  concentration for many patients. However, some  are best treated at concentrations outside of this range. Acetaminophen concentrations >150 ug/mL at 4 hours after ingestion  and >50 ug/mL at 12 hours after ingestion are often associated with  toxic reactions. Performed at [facility], [address] [place], [place] [address]   CBC     Status: None   Collection Time: 10/25/19  3:42 PM  Result Value Ref Range   WBC 5.8 4.0 - 10.5 K/uL   RBC 5.04 3.87 - 5.11 MIL/uL   Hemoglobin 14.3 12.0 - 15.0 g/dL   HCT 60.4 54.0 - 98.1 %   MCV 89.1 80.0 - 100.0 fL   MCH 28.4 26.0 - 34.0 pg   MCHC 31.8 30.0 - 36.0 g/dL   RDW 19.1 47.8 - 29.5 %   Platelets 303 150 - 400 K/uL   nRBC 0.0 0.0 - 0.2  %    Comment: Performed at [facility], [address] [place], [place] [address]  TSH     Status: None   Collection Time: 10/25/19  3:42 PM  Result Value Ref Range   TSH 0.816 0.350 - 4.500 uIU/mL    Comment: Performed by a 3rd Generation assay with a functional sensitivity of <=0.01 uIU/mL. Performed at [facility], [address] [place], [place] [address]   Urinalysis, Routine w reflex microscopic     Status: Abnormal   Collection Time: 10/25/19  3:42 PM  Result Value Ref Range   Color, Urine AMBER (A) YELLOW    Comment: BIOCHEMICALS MAY BE AFFECTED BY COLOR   APPearance CLOUDY (A) CLEAR   Specific Gravity, Urine 1.023 1.005 - 1.030   pH 5.0 5.0 - 8.0   Glucose, UA NEGATIVE NEGATIVE mg/dL   Hgb urine dipstick NEGATIVE NEGATIVE   Bilirubin Urine NEGATIVE NEGATIVE   Ketones, ur NEGATIVE NEGATIVE mg/dL   Protein, ur 30 (A) NEGATIVE mg/dL   Nitrite NEGATIVE NEGATIVE   Leukocytes,Ua NEGATIVE NEGATIVE   RBC / HPF 0-5 0 - 5 RBC/hpf   WBC, UA NONE SEEN 0 - 5 WBC/hpf   Bacteria, UA NONE SEEN NONE SEEN   Squamous Epithelial / LPF 6-10 0 - 5   Mucus PRESENT    Hyaline Casts, UA PRESENT    Ca Oxalate Crys, UA PRESENT     Comment: Performed at [facility], [address] [place], [place] [address]  SARS Coronavirus 2 by RT PCR (hospital order, performed in [facility] hospital lab) Nasopharyngeal Nasopharyngeal Swab     Status: None   Collection Time: 10/25/19  7:33 PM   Specimen: Nasopharyngeal Swab  Result Value Ref Range   SARS Coronavirus 2 NEGATIVE NEGATIVE    Comment: (NOTE) SARS-CoV-2 target nucleic acids are NOT DETECTED. The SARS-CoV-2 RNA is generally detectable in upper and lower respiratory specimens during the acute phase of infection. The lowest concentration of SARS-CoV-2 viral copies this assay can detect is 250 copies / mL. A negative result does not preclude SARS-CoV-2 infection and should not be used as the sole basis for  treatment or other patient management decisions.  A negative result may occur with improper specimen collection / handling, submission of specimen other than nasopharyngeal swab, presence of viral mutation(s) within the areas targeted by this assay, and inadequate number of viral copies (<250 copies / mL). A negative result must be combined with clinical observations, patient history, and epidemiological information. Fact Sheet for Patients:   BoilerBrush.com.cy Fact Sheet for Healthcare Providers:  https://pope.com/ This test is not yet approved or cleared  by the [place] and has been authorized for detection and/or diagnosis of SARS-CoV-2 by FDA under an Emergency Use Authorization (EUA).  This EUA will remain in effect (meaning this test can be used) for the duration of the COVID-19 declaration under Section 564(b)(1) of the Act, 21 U.S.C. section 360bbb-3(b)(1), unless the authorization is terminated or revoked sooner. Performed at [facility], [address] [place], [place] [address]     Blood Alcohol level:  Lab Results  Component Value Date   [facility] <10 10/25/2019   ETH <5 09/23/2016    Metabolic Disorder Labs:  Lab Results  Component Value Date   HGBA1C 5.7 09/10/2019   No results found for: PROLACTIN Lab Results  Component Value Date   CHOL 168 09/10/2019   TRIG 100.0 09/10/2019   HDL 47.70 09/10/2019   CHOLHDL 4 09/10/2019   VLDL 20.0 09/10/2019   LDLCALC 100 (H) 09/10/2019    Current Medications: Current Facility-Administered Medications  Medication Dose Route Frequency Provider Last Rate Last Admin  . acetaminophen (TYLENOL) tablet 650 mg  650 mg Oral Q6H PRN Dixon, Rashaun M, NP      . alum & mag hydroxide-simeth (MAALOX/MYLANTA) 200-200-20 MG/5ML suspension 30 mL  30 mL Oral Q4H PRN Dixon, Rashaun M, NP      . amLODipine (NORVASC) tablet 5 mg  5 mg Oral Daily Dixon, Rashaun M, NP      .  diphenhydrAMINE (BENADRYL) capsule 25 mg  25 mg Oral Q6H PRN Dixon, Rashaun M, NP      . gabapentin (NEURONTIN) capsule 100 mg  100 mg Oral QHS Dixon, Rashaun M, NP      . LORazepam (ATIVAN) tablet 1 mg  1 mg Oral Q4H PRN Dixon, Rashaun M, NP      . magnesium hydroxide (MILK OF MAGNESIA) suspension 30 mL  30 mL Oral Daily PRN Dixon, Rashaun M, NP      . metoprolol succinate (TOPROL-XL) 24 hr tablet 100 mg  100 mg Oral Daily Dixon, Rashaun M, NP      . QUEtiapine (SEROQUEL) tablet 25 mg  25 mg Oral BID Dixon, Rashaun M, NP      . traZODone (DESYREL) tablet 50 mg  50 mg Oral QHS PRN Jearld Lesch, NP       PTA Medications: Medications Prior to Admission  Medication Sig Dispense Refill Last Dose  . amLODipine (NORVASC) 5 MG tablet Take 1 tablet (5 mg total) by mouth daily. 90 tablet 3   . gabapentin (NEURONTIN) 100 MG capsule Take one tablet ( 100 mg) by mouth one hour before bedtime every night. 30 capsule 1   . metoprolol succinate (TOPROL-XL) 100 MG 24 hr tablet Take 1 tablet (100 mg total) by mouth daily. 60 tablet 3     Musculoskeletal: Strength & Muscle Tone: within normal limits Gait & Station: normal Patient leans: N/A  Psychiatric Specialty Exam: Physical Exam  Nursing note and vitals reviewed. Constitutional: She is oriented to person, place, and time. She appears well-developed and well-nourished. No distress.  HENT:  Head: Normocephalic and atraumatic.  Eyes: EOM are normal.  Cardiovascular: Normal rate.  Respiratory: Effort normal. No respiratory distress.  Musculoskeletal:        General: Normal range of motion.     Cervical back: Normal range of motion.  Neurological: She is alert and oriented to person, place, and time.  Skin: No rash noted. No erythema.  Review of Systems  Constitutional: Negative.   HENT: Negative.   Respiratory: Negative.   Cardiovascular: Negative.   Gastrointestinal: Negative.   Musculoskeletal: Negative.   Neurological: Negative.    Psychiatric/Behavioral: Negative for agitation, behavioral problems, confusion, decreased concentration, dysphoric mood, hallucinations, self-injury, sleep disturbance and suicidal ideas. The patient is not nervous/anxious and is not hyperactive.     Blood pressure (!) 146/91, pulse 100, temperature 99.3 F (37.4 C), temperature source Oral, resp. rate 16, height 5\' 6"  (1.676 m), weight 86.2 kg, SpO2 100 %.Body mass index is 30.67 kg/m.  General Appearance: Casual and Fairly Groomed  Eye Contact:  Good  Speech:  Clear and Coherent  Volume:  Normal  Mood:  Euthymic  Affect:  Calm  Thought Process:  Coherent and Descriptions of Associations: Circumstantial  Orientation:  Full (Time, Place, and Person)  Thought Content:  Logical, Delusions, Hallucinations: None and Paranoid Ideation  Suicidal Thoughts:  No  Homicidal Thoughts:  No  Memory:  Immediate;   Good Recent;   Good Remote;   Good  Judgement:  Good  Insight:  Fair  Psychomotor Activity:  Normal  Concentration:  Concentration: Good and Attention Span: Good  Recall:  Good  Fund of Knowledge:  Good  Language:  Good  Akathisia:  Negative  Handed:  Right  AIMS (if indicated):     Assets:  Communication Skills Desire for Improvement Financial Resources/Insurance Housing Social Support  ADL's:  Intact  Cognition:  WNL  Sleep:   reports she slept "great"    Treatment Plan Summary: Daily contact with patient to assess and evaluate symptoms and progress in treatment and Medication management  Observation Level/Precautions:  Elopement 15 minute checks  Laboratory:  Reviewed from emergency department   Psychotherapy: Attend groups  Medications: Seroquel 25 mg at bedtime, continue home antihypertensive medications  Consultations: None  Discharge Concerns: Family strife  Estimated LOS: 5 days  Other: Collateral to be obtained from other family members.   Physician Treatment Plan for Primary Diagnosis: Acute psychosis  (HCC) Erin Orr Term Goal(s): Improvement in symptoms so as ready for discharge  Short Term Goals: Ability to identify changes in lifestyle to reduce recurrence of condition will improve, Ability to identify and develop effective coping behaviors will improve, Ability to maintain clinical measurements within normal limits will improve and Compliance with prescribed medications will improve  Physician Treatment Plan for Secondary Diagnosis: Principal Problem:   Acute psychosis (HCC) Active Problems:   Hypertension   Lumbar radiculitis   Hot flashes   Delusion (HCC)  Erin Orr Term Goal(s): Improvement in symptoms so as ready for discharge  Short Term Goals: Ability to identify changes in lifestyle to reduce recurrence of condition will improve, Ability to verbalize feelings will improve, Ability to identify and develop effective coping behaviors will improve, Ability to maintain clinical measurements within normal limits will improve and Compliance with prescribed medications will improve  I certify that inpatient services furnished can reasonably be expected to improve the patient's condition.    , MD 5/16/202110:20 AM

## 2019-10-27 NOTE — [facility] Group Notes (Signed)
LCSW Group Therapy Note 10/27/2019 1:15pm  Type of Therapy and Topic: Group Therapy: Feelings Around Returning Home & Establishing a Supportive Framework and Supporting Oneself When Supports Not Available  Participation Level: Did Not Attend  Description of Group:  Patients first processed thoughts and feelings about upcoming discharge. These included fears of upcoming changes, lack of change, new living environments, judgements and expectations from others and overall stigma of mental health issues. The group then discussed the definition of a supportive framework, what that looks and feels like, and how do to discern it from an unhealthy non-supportive network. The group identified different types of supports as well as what to do when your family/friends are less than helpful or unavailable  Therapeutic Goals  1. Patient will identify one healthy supportive network that they can use at discharge. 2. Patient will identify one factor of a supportive framework and how to tell it from an unhealthy network. 3. Patient able to identify one coping skill to use when they do not have positive supports from others. 4. Patient will demonstrate ability to communicate their needs through discussion and/or role plays.  Summary of Patient Progress:  Patient did not attend group.  Therapeutic Modalities Cognitive Behavioral Therapy Motivational Interviewing   Johnnye Sima, LCSW 10/27/2019 12:31 PM

## 2019-10-27 NOTE — Progress Notes (Signed)
Patient is 59 yr old female admitted due to bizarre behavior.  She is alert and oriented and takes a while before her thoughts and conversation become incoherent and she seems confused about the reason she is on the unit. She wants information as to how [place] she is going to be on the unit and wants assurance that this is a short term stay. She was cooperative with the assessment and answered all questions appropriately. She denied having any pain. She denied SI/HI/AVH depression and anxiety during the assessment. Her skin assessment conducted with RN Britta Mccreedy was un remarkable, no surgical scars no bruising. No contraband found on person or in her belongings.  Patient oriented to the unit. Patient informed to contact staff with any questions or concerns.  She remains safe on the unit with 15 minute safety checks.

## 2019-10-27 NOTE — Progress Notes (Signed)
Patient refused her morning medication, stating that she only takes Lisinopril, "I only have issues with my blood pressure, other than that I'm a healthy woman". MD will be notified.

## 2019-10-28 MED ORDER — AMLODIPINE BESYLATE 5 MG PO TABS
10.0000 mg | ORAL_TABLET | Freq: Every day | ORAL | Status: DC
  Administered 2019-10-29 – 2019-11-04 (×6): 10 mg via ORAL
  Filled 2019-10-28 (×7): qty 2

## 2019-10-28 NOTE — Progress Notes (Signed)
Patient refused Seroquel again, MD will be notified.

## 2019-10-28 NOTE — Plan of Care (Signed)
D- Patient alert and oriented. Patient presented in a pleasant mood on assessment stating that she slept "good, I was very comfortable", last night and had no complaints or concerns to voice to this Clinical research associate. Patient denies SI, HI, AVH, and pain at this time. Patient also denied any signs/sypmtoms of depression/anxiety, reporting that overall she is feeling "10/10, that would be the highest". Patient had no stated goals for today.  A- Scheduled medications administered to patient, per MD orders. Support and encouragement provided.  Routine safety checks conducted every 15 minutes.  Patient informed to notify staff with problems or concerns.  R- No adverse drug reactions noted. Patient contracts for safety at this time. Patient compliant with medications and treatment plan. Patient receptive, calm, and cooperative. Patient interacts well with others on the unit.  Patient remains safe at this time.  D- Patient alert and oriented. Affect/mood. Denies SI, HI, AVH, and pain. Quotes. Goal. How did they do with achieving previous goal.  A- Scheduled medications administered to patient, per MD orders. Support and encouragement provided.  Routine safety checks conducted every 15 minutes.  Patient informed to notify staff with problems or concerns.  R- No adverse drug reactions noted. Patient contracts for safety at this time. Patient compliant with medications and treatment plan. Patient receptive, calm, and cooperative. Patient interacts well with others on the unit.  Patient remains safe at this time.  Problem: Education: Goal: Knowledge of [place] [facility] Education information/materials will improve Outcome: Progressing Goal: Emotional status will improve Outcome: Progressing Goal: Mental status will improve Outcome: Progressing Goal: Verbalization of understanding the information provided will improve Outcome: Progressing   Problem: Activity: Goal: Interest or engagement in activities will  improve Outcome: Progressing Goal: Sleeping patterns will improve Outcome: Progressing   Problem: Coping: Goal: Ability to verbalize frustrations and anger appropriately will improve Outcome: Progressing Goal: Ability to demonstrate self-control will improve Outcome: Progressing   Problem: Health Behavior/Discharge Planning: Goal: Identification of resources available to assist in meeting health care needs will improve Outcome: Progressing Goal: Compliance with treatment plan for underlying cause of condition will improve Outcome: Progressing   Problem: Physical Regulation: Goal: Ability to maintain clinical measurements within normal limits will improve Outcome: Progressing   Problem: Safety: Goal: Periods of time without injury will increase Outcome: Progressing   Problem: Activity: Goal: Will verbalize the importance of balancing activity with adequate rest periods Outcome: Progressing   Problem: Education: Goal: Will be free of psychotic symptoms Outcome: Progressing Goal: Knowledge of the prescribed therapeutic regimen will improve Outcome: Progressing   Problem: Coping: Goal: Coping ability will improve Outcome: Progressing Goal: Will verbalize feelings Outcome: Progressing   Problem: Health Behavior/Discharge Planning: Goal: Compliance with prescribed medication regimen will improve Outcome: Progressing   Problem: Nutritional: Goal: Ability to achieve adequate nutritional intake will improve Outcome: Progressing   Problem: Role Relationship: Goal: Ability to communicate needs accurately will improve Outcome: Progressing Goal: Ability to interact with others will improve Outcome: Progressing   Problem: Safety: Goal: Ability to redirect hostility and anger into socially appropriate behaviors will improve Outcome: Progressing Goal: Ability to remain free from injury will improve Outcome: Progressing   Problem: Self-Care: Goal: Ability to participate  in self-care as condition permits will improve Outcome: Progressing   Problem: Self-Concept: Goal: Will verbalize positive feelings about self Outcome: Progressing

## 2019-10-28 NOTE — Plan of Care (Signed)
  Problem: Education: Goal: Knowledge of [place] [facility] Education information/materials will improve Outcome: Progressing Goal: Emotional status will improve Outcome: Progressing Goal: Mental status will improve Outcome: Progressing Goal: Verbalization of understanding the information provided will improve Outcome: Progressing   Problem: Activity: Goal: Interest or engagement in activities will improve Outcome: Progressing Goal: Sleeping patterns will improve Outcome: Progressing   Problem: Coping: Goal: Ability to verbalize frustrations and anger appropriately will improve Outcome: Progressing Goal: Ability to demonstrate self-control will improve Outcome: Progressing   Problem: Health Behavior/Discharge Planning: Goal: Identification of resources available to assist in meeting health care needs will improve Outcome: Progressing Goal: Compliance with treatment plan for underlying cause of condition will improve Outcome: Progressing   Problem: Physical Regulation: Goal: Ability to maintain clinical measurements within normal limits will improve Outcome: Progressing   Problem: Safety: Goal: Periods of time without injury will increase Outcome: Progressing   Problem: Activity: Goal: Will verbalize the importance of balancing activity with adequate rest periods Outcome: Progressing   Problem: Education: Goal: Will be free of psychotic symptoms Outcome: Progressing Goal: Knowledge of the prescribed therapeutic regimen will improve Outcome: Progressing   Problem: Coping: Goal: Coping ability will improve Outcome: Progressing Goal: Will verbalize feelings Outcome: Progressing   Problem: Health Behavior/Discharge Planning: Goal: Compliance with prescribed medication regimen will improve Outcome: Progressing   Problem: Nutritional: Goal: Ability to achieve adequate nutritional intake will improve Outcome: Progressing   Problem: Role Relationship: Goal:  Ability to communicate needs accurately will improve Outcome: Progressing Goal: Ability to interact with others will improve Outcome: Progressing   Problem: Safety: Goal: Ability to redirect hostility and anger into socially appropriate behaviors will improve Outcome: Progressing Goal: Ability to remain free from injury will improve Outcome: Progressing   Problem: Self-Care: Goal: Ability to participate in self-care as condition permits will improve Outcome: Progressing   Problem: Self-Concept: Goal: Will verbalize positive feelings about self Outcome: Progressing   

## 2019-10-28 NOTE — [facility] Group Notes (Signed)
Balance In Life 10/28/2019 1PM  Type of Therapy/Topic:  Group Therapy:  Balance in Life  Participation Level:  Did Not Attend  Description of Group:   This group will address the concept of balance and how it feels and looks when one is unbalanced. Patients will be encouraged to process areas in their lives that are out of balance and identify reasons for remaining unbalanced. Facilitators will guide patients in utilizing problem-solving interventions to address and correct the stressor making their life unbalanced. Understanding and applying boundaries will be explored and addressed for obtaining and maintaining a balanced life. Patients will be encouraged to explore ways to assertively make their unbalanced needs known to significant others in their lives, using other group members and facilitator for support and feedback.  Therapeutic Goals: 1. Patient will identify two or more emotions or situations they have that consume much of in their lives. 2. Patient will identify signs/triggers that life has become out of balance:  3. Patient will identify two ways to set boundaries in order to achieve balance in their lives:  4. Patient will demonstrate ability to communicate their needs through discussion and/or role plays  Summary of Patient Progress:    Therapeutic Modalities:   Cognitive Behavioral Therapy Solution-Focused Therapy Assertiveness Training  Lipa Knauff T Dorita Rowlands, LCSW  

## 2019-10-28 NOTE — Progress Notes (Signed)
[facility] MD Progress Note  10/28/2019 5:28 PM Erin Orr  MRN:  [id number] Subjective: Follow-up for this patient with psychotic symptoms.  On interview today the patient minimized everything that brought her into the hospital.  She claimed that it was her husband who is the one who is acting strangely.  Nevertheless she did eventually admit to believing that he might be a different person than who she had known before.  She denies some of the examples of psychosis mentioned in the chart.  No specific medical complaints.  I spoke with her son with her consent however and her son tells a story of longstanding and worsening psychosis.  He tells me that his mother frequently hallucinates, has recently been talking about being in [place] and has been showing hyper religious psychosis.  He describes to me incidents in the past of psychotic behavior that of wound her up in hospitals.  He tells me that she frequently can present herself as being stable for short periods of time.  Patient has been refusing psychiatric medicine Principal Problem: Acute psychosis ([place]) Diagnosis: Principal Problem:   Acute psychosis ([place]) Active Problems:   Hypertension   Lumbar radiculitis   Hot flashes   Delusion ([place])  Total Time spent with patient: 30 minutes  Past Psychiatric History: Apparently she has had more than 1 episode of psychosis that has wound her up in the hospital in the past.  However she also has had extended periods of general responsibility.  Sounds mostly like bipolar or schizoaffective  Past Medical History:  Past Medical History:  Diagnosis Date  . Hypertension    History reviewed. No pertinent surgical history. Family History:  Family History  Problem Relation Age of Onset  . Heart disease Brother        has 3 brothers  . Heart attack Brother   . Colon cancer Neg Hx   . Breast cancer Neg Hx    Family Psychiatric  History: Son tells me that there actually is a pretty clear family  history of other people with psychotic disorder Social History:  Social History   Substance and Sexual Activity  Alcohol Use Never     Social History   Substance and Sexual Activity  Drug Use Never    Social History   Socioeconomic History  . Marital status: Legally Separated    Spouse name: Not on file  . Number of children: Not on file  . Years of education: Not on file  . Highest education level: Not on file  Occupational History  . Not on file  Tobacco Use  . Smoking status: Never Smoker  . Smokeless tobacco: Never Used  Substance and Sexual Activity  . Alcohol use: Never  . Drug use: Never  . Sexual activity: Not on file  Other Topics Concern  . 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 Difficulty of Paying Living Expenses:   Food Insecurity:   . Worried About Charity fundraiser in the Last Year:   . Arboriculturist in the Last Year:   Transportation Needs:   . Film/video editor (Medical):   Marland Kitchen Lack of Transportation (Non-Medical):   Physical Activity:   . Days of Exercise per Week:   . Minutes of Exercise per Session:   Stress:   . Feeling of Stress :  Social Connections:   . Frequency of Communication with Friends and Family:   . Frequency of Social Gatherings with Friends and Family:   . Attends Religious Services:   . Active Member of Clubs or Organizations:   . Attends Banker Meetings:   Marland Kitchen Marital Status:    Additional Social History:                         Sleep: Fair  Appetite:  Fair  Current Medications: Current Facility-Administered Medications  Medication Dose Route Frequency Provider Last Rate Last Admin  . acetaminophen (TYLENOL) tablet 650 mg  650 mg Oral Q6H PRN Dixon, Elray Buba, NP      . alum & mag hydroxide-simeth (MAALOX/MYLANTA) 200-200-20 MG/5ML suspension 30 mL  30 mL Oral Q4H PRN  Jearld Lesch, NP      . Melene Muller ON 10/29/2019] amLODipine (NORVASC) tablet 10 mg  10 mg Oral Daily Nilan Iddings T, MD      . diphenhydrAMINE (BENADRYL) capsule 25 mg  25 mg Oral Q6H PRN Jearld Lesch, NP   25 mg at 10/27/19 2147  . gabapentin (NEURONTIN) capsule 100 mg  100 mg Oral BID BM & HS PRN Mariel Craft, MD   100 mg at 10/27/19 2147  . LORazepam (ATIVAN) tablet 1 mg  1 mg Oral Q4H PRN Dixon, Rashaun M, NP      . magnesium hydroxide (MILK OF MAGNESIA) suspension 30 mL  30 mL Oral Daily PRN Dixon, Rashaun M, NP      . metoprolol succinate (TOPROL-XL) 24 hr tablet 100 mg  100 mg Oral Daily Dixon, Rashaun M, NP   100 mg at 10/28/19 0830  . traZODone (DESYREL) tablet 50 mg  50 mg Oral QHS PRN Jearld Lesch, NP   50 mg at 10/27/19 2147    Lab Results: No results found for this or any previous visit (from the past 48 hour(s)).  Blood Alcohol level:  Lab Results  Component Value Date   ETH <10 10/25/2019   ETH <5 09/23/2016    Metabolic Disorder Labs: Lab Results  Component Value Date   HGBA1C 5.7 09/10/2019   No results found for: PROLACTIN Lab Results  Component Value Date   CHOL 168 09/10/2019   TRIG 100.0 09/10/2019   HDL 47.70 09/10/2019   CHOLHDL 4 09/10/2019   VLDL 20.0 09/10/2019   LDLCALC 100 (H) 09/10/2019    Physical Findings: AIMS: Facial and Oral Movements Muscles of Facial Expression: None, normal Lips and Perioral Area: None, normal Jaw: None, normal Tongue: None, normal,Extremity Movements Upper (arms, wrists, hands, fingers): None, normal Lower (legs, knees, ankles, toes): None, normal, Trunk Movements Neck, shoulders, hips: None, normal, Overall Severity Severity of abnormal movements (highest score from questions above): None, normal Incapacitation due to abnormal movements: None, normal Patient's awareness of abnormal movements (rate only patient's report): No Awareness, Dental Status Current problems with teeth and/or dentures?: No Does  patient usually wear dentures?: No  CIWA:    COWS:     Musculoskeletal: Strength & Muscle Tone: within normal limits Gait & Station: normal Patient leans: N/A  Psychiatric Specialty Exam: Physical Exam  Nursing note and vitals reviewed. Constitutional: She appears well-developed and well-nourished.  HENT:  Head: Normocephalic and atraumatic.  Eyes: Pupils are equal, round, and reactive to light. Conjunctivae are normal.  Cardiovascular: Regular rhythm and normal heart sounds.  Respiratory: Effort normal. No respiratory distress.  GI: Soft.  Musculoskeletal:        General: Normal range of motion.     Cervical back: Normal range of motion.  Neurological: She is alert.  Skin: Skin is warm and dry.  Psychiatric: Her affect is blunt. Her speech is tangential. She is withdrawn. She is not agitated and not aggressive. Thought content is paranoid and delusional. Cognition and memory are impaired. She expresses inappropriate judgment. She expresses no homicidal and no suicidal ideation.    Review of Systems  Constitutional: Negative.   HENT: Negative.   Eyes: Negative.   Respiratory: Negative.   Cardiovascular: Negative.   Gastrointestinal: Negative.   Musculoskeletal: Negative.   Skin: Negative.   Neurological: Negative.   Psychiatric/Behavioral: Positive for confusion. The patient is nervous/anxious.     Blood pressure (!) 145/97, pulse 93, temperature 98.6 F (37 C), temperature source Oral, resp. rate 18, height 5\' 6"  (1.676 m), weight 86.2 kg, SpO2 99 %.Body mass index is 30.67 kg/m.  General Appearance: Casual  Eye Contact:  Fair  Speech:  Slow  Volume:  Decreased  Mood:  Euthymic  Affect:  Constricted  Thought Process:  Coherent  Orientation:  Full (Time, Place, and Person)  Thought Content:  Illogical, Delusions, Paranoid Ideation and Tangential  Suicidal Thoughts:  No  Homicidal Thoughts:  No  Memory:  Immediate;   Fair Recent;   Poor Remote;   Poor  Judgement:   Impaired  Insight:  Lacking  Psychomotor Activity:  Decreased  Concentration:  Concentration: Poor  Recall:  Poor  Fund of Knowledge:  Fair  Language:  Fair  Akathisia:  No  Handed:  Right  AIMS (if indicated):     Assets:  Housing Physical Health Resilience  ADL's:  Intact  Cognition:  WNL  Sleep:  Number of Hours: 8     Treatment Plan Summary: Daily contact with patient to assess and evaluate symptoms and progress in treatment, Medication management and Plan Based on what her son is telling me it sounds clear that the patient has a chronic psychotic disorder.  Family is justifiably worried that she will continue to decompensate without treatment.  Goal therefore will be to try to get her on Sitzmann-acting injectable medication which is going to take some work.  For now continue her usual medical medication and reassess about treatment tomorrow.  , MD 10/28/2019, 5:28 PM

## 2019-10-28 NOTE — Plan of Care (Signed)
  Problem: Education: Goal: Knowledge of [place] [facility] Education information/materials will improve Outcome: Progressing Goal: Emotional status will improve Outcome: Progressing Goal: Mental status will improve Outcome: Progressing Goal: Verbalization of understanding the information provided will improve Outcome: Progressing   Problem: Activity: Goal: Interest or engagement in activities will improve Outcome: Progressing Goal: Sleeping patterns will improve Outcome: Progressing   Problem: Coping: Goal: Ability to verbalize frustrations and anger appropriately will improve Outcome: Progressing Goal: Ability to demonstrate self-control will improve Outcome: Progressing   Problem: Health Behavior/Discharge Planning: Goal: Identification of resources available to assist in meeting health care needs will improve Outcome: Progressing Goal: Compliance with treatment plan for underlying cause of condition will improve Outcome: Progressing   Problem: Physical Regulation: Goal: Ability to maintain clinical measurements within normal limits will improve Outcome: Progressing   Problem: Safety: Goal: Periods of time without injury will increase Outcome: Progressing   Problem: Activity: Goal: Will verbalize the importance of balancing activity with adequate rest periods Outcome: Progressing   Problem: Education: Goal: Will be free of psychotic symptoms Outcome: Progressing Goal: Knowledge of the prescribed therapeutic regimen will improve Outcome: Progressing   Problem: Coping: Goal: Coping ability will improve Outcome: Progressing Goal: Will verbalize feelings Outcome: Progressing   Problem: Health Behavior/Discharge Planning: Goal: Compliance with prescribed medication regimen will improve Outcome: Progressing   Problem: Nutritional: Goal: Ability to achieve adequate nutritional intake will improve Outcome: Progressing   Problem: Role Relationship: Goal:  Ability to communicate needs accurately will improve Outcome: Progressing Goal: Ability to interact with others will improve Outcome: Progressing   Problem: Safety: Goal: Ability to redirect hostility and anger into socially appropriate behaviors will improve Outcome: Progressing Goal: Ability to remain free from injury will improve Outcome: Progressing   Problem: Self-Care: Goal: Ability to participate in self-care as condition permits will improve Outcome: Progressing   Problem: Self-Concept: Goal: Will verbalize positive feelings about self Outcome: Progressing   

## 2019-10-28 NOTE — Progress Notes (Signed)
Patient is pleasant and cooperative. She presents soft spoken with coherent and logical speech and thought. She denied SI/HI/AH/VH depression and anxiety during this encounter. She received her prescribed meds and tolerated without incident. Patient is active and interacts well with others on the unit. She does not present as paranoid but continues to refuse food that it is not pre packaged. Stating she believes someone may have tampered with the food.  She remains safe on the unit with 15 minute safety checks.  Informed to contact staff with any concerns.

## 2019-10-28 NOTE — Progress Notes (Signed)
Recreation Therapy Notes  Date: 10/28/2019  Time: 9:30 am   Location: Craft room    Behavioral response: N/A   Intervention Topic: Coping Skills   Discussion/Intervention: Patient did not attend group.   Clinical Observations/Feedback:  Patient did not attend group.   Gustavo Dispenza LRT/CTRS        Vaishnav Demartin 10/28/2019 11:06 AM

## 2019-10-28 NOTE — Progress Notes (Signed)
Patient is pleasant and easy to engage.  Active on the unit with other peers. Has no meds due for the evening and reports sleeping well through the night. Her eating has increased since yesterday. She denies SI/HI/AVH depression and anxiety during this encounter. Patient is safe with 15 minute safety checks and informed to contact staff with any concerns.

## 2019-10-29 MED ORDER — PALIPERIDONE ER 3 MG PO TB24
6.0000 mg | ORAL_TABLET | Freq: Every day | ORAL | Status: DC
  Administered 2019-10-29 – 2019-10-30 (×2): 6 mg via ORAL
  Filled 2019-10-29 (×2): qty 2

## 2019-10-29 NOTE — BHH Group Notes (Signed)
LCSW Group Therapy Note  10/29/2019 2:39 PM  Type of Therapy/Topic:  Group Therapy:  Feelings about Diagnosis  Participation Level:  Did Not Attend   Description of Group:   This group will allow patients to explore their thoughts and feelings about diagnoses they have received. Patients will be guided to explore their level of understanding and acceptance of these diagnoses. Facilitator will encourage patients to process their thoughts and feelings about the reactions of others to their diagnosis and will guide patients in identifying ways to discuss their diagnosis with significant others in their lives. This group will be process-oriented, with patients participating in exploration of their own experiences, giving and receiving support, and processing challenge from other group members.   Therapeutic Goals: 1. Patient will demonstrate understanding of diagnosis as evidenced by identifying two or more symptoms of the disorder 2. Patient will be able to express two feelings regarding the diagnosis 3. Patient will demonstrate their ability to communicate their needs through discussion and/or role play  Summary of Patient Progress: X  Therapeutic Modalities:   Cognitive Behavioral Therapy Brief Therapy Feelings Identification   Penni Homans, MSW, LCSW 10/29/2019 2:39 PM

## 2019-10-29 NOTE — Tx Team (Addendum)
Interdisciplinary Treatment and Diagnostic Plan Update  10/29/2019 Time of Session: 9am Erin Orr MRN: [id number]  Principal Diagnosis: Acute psychosis [facility])  Secondary Diagnoses: Principal Problem:   Acute psychosis ([employer]) Active Problems:   Hypertension   Lumbar radiculitis   Hot flashes   Delusion ([place])   Current Medications:  Current Facility-Administered Medications  Medication Dose Route Frequency Provider Last Rate Last Admin  . acetaminophen (TYLENOL) tablet 650 mg  650 mg Oral Q6H PRN Dixon, Rashaun M, NP      . alum & mag hydroxide-simeth (MAALOX/MYLANTA) 200-200-20 MG/5ML suspension 30 mL  30 mL Oral Q4H PRN Dixon, Rashaun M, NP      . amLODipine (NORVASC) tablet 10 mg  10 mg Oral Daily Clapacs, Madie Reno, MD   10 mg at 10/29/19 0759  . diphenhydrAMINE (BENADRYL) capsule 25 mg  25 mg Oral Q6H PRN Deloria Lair, NP   25 mg at 10/27/19 2147  . gabapentin (NEURONTIN) capsule 100 mg  100 mg Oral BID BM & HS PRN [name], MD   100 mg at 10/27/19 2147  . LORazepam (ATIVAN) tablet 1 mg  1 mg Oral Q4H PRN Dixon, Rashaun M, NP      . magnesium hydroxide (MILK OF MAGNESIA) suspension 30 mL  30 mL Oral Daily PRN Dixon, Rashaun M, NP      . metoprolol succinate (TOPROL-XL) 24 hr tablet 100 mg  100 mg Oral Daily Dixon, Rashaun M, NP   100 mg at 10/29/19 0759  . traZODone (DESYREL) tablet 50 mg  50 mg Oral QHS PRN Deloria Lair, NP   50 mg at 10/27/19 2147   PTA Medications: Medications Prior to Admission  Medication Sig Dispense Refill Last Dose  . amLODipine (NORVASC) 5 MG tablet Take 1 tablet (5 mg total) by mouth daily. 90 tablet 3   . gabapentin (NEURONTIN) 100 MG capsule Take one tablet ( 100 mg) by mouth one hour before bedtime every night. 30 capsule 1   . metoprolol succinate (TOPROL-XL) 100 MG 24 hr tablet Take 1 tablet (100 mg total) by mouth daily. 60 tablet 3     Patient Stressors: Marital or family conflict Other: Paranoia, mistrust of  others  Patient Strengths: Active sense of humor Physical Health Supportive family/friends  Treatment Modalities: Medication Management, Group therapy, Case management,  1 to 1 session with clinician, Psychoeducation, Recreational therapy.   Physician Treatment Plan for Primary Diagnosis: Acute psychosis ([place]) [place] Term Goal(s): Improvement in symptoms so as ready for discharge Improvement in symptoms so as ready for discharge   Short Term Goals: Ability to identify changes in lifestyle to reduce recurrence of condition will improve Ability to identify and develop effective coping behaviors will improve Ability to maintain clinical measurements within normal limits will improve Compliance with prescribed medications will improve Ability to identify changes in lifestyle to reduce recurrence of condition will improve Ability to verbalize feelings will improve Ability to identify and develop effective coping behaviors will improve Ability to maintain clinical measurements within normal limits will improve Compliance with prescribed medications will improve  Medication Management: Evaluate patient's response, side effects, and tolerance of medication regimen.  Therapeutic Interventions: 1 to 1 sessions, Unit Group sessions and Medication administration.  Evaluation of Outcomes: Not Met  Physician Treatment Plan for Secondary Diagnosis: Principal Problem:   Acute psychosis ([place]) Active Problems:   Hypertension   Lumbar radiculitis   Hot flashes   Delusion ([place])  [place] Term Goal(s): Improvement in  symptoms so as ready for discharge Improvement in symptoms so as ready for discharge   Short Term Goals: Ability to identify changes in lifestyle to reduce recurrence of condition will improve Ability to identify and develop effective coping behaviors will improve Ability to maintain clinical measurements within normal limits will improve Compliance with prescribed medications will  improve Ability to identify changes in lifestyle to reduce recurrence of condition will improve Ability to verbalize feelings will improve Ability to identify and develop effective coping behaviors will improve Ability to maintain clinical measurements within normal limits will improve Compliance with prescribed medications will improve     Medication Management: Evaluate patient's response, side effects, and tolerance of medication regimen.  Therapeutic Interventions: 1 to 1 sessions, Unit Group sessions and Medication administration.  Evaluation of Outcomes: Not Met   RN Treatment Plan for Primary Diagnosis: Acute psychosis ([place]) [place] Term Goal(s): Knowledge of disease and therapeutic regimen to maintain health will improve  Short Term Goals: Ability to participate in decision making will improve, Ability to verbalize feelings will improve, Ability to identify and develop effective coping behaviors will improve and Compliance with prescribed medications will improve  Medication Management: RN will administer medications as ordered by provider, will assess and evaluate patient's response and provide education to patient for prescribed medication. RN will report any adverse and/or side effects to prescribing provider.  Therapeutic Interventions: 1 on 1 counseling sessions, Psychoeducation, Medication administration, Evaluate responses to treatment, Monitor vital signs and CBGs as ordered, Perform/monitor CIWA, COWS, AIMS and Fall Risk screenings as ordered, Perform wound care treatments as ordered.  Evaluation of Outcomes: Not Met   LCSW Treatment Plan for Primary Diagnosis: Acute psychosis ([place]) [place] Term Goal(s): Safe transition to appropriate next level of care at discharge, Engage patient in therapeutic group addressing interpersonal concerns.  Short Term Goals: Engage patient in aftercare planning with referrals and resources  Therapeutic Interventions: Assess for all discharge  needs, 1 to 1 time with Social worker, Explore available resources and support systems, Assess for adequacy in community support network, Educate family and significant other(s) on suicide prevention, Complete Psychosocial Assessment, Interpersonal group therapy.  Evaluation of Outcomes: Not Met   Progress in Treatment: Attending groups: No. Participating in groups: No. Taking medication as prescribed: Yes. Toleration medication: Yes. Family/Significant other contact made: No, will contact:  when pt gives consent Patient understands diagnosis: Yes. Discussing patient identified problems/goals with staff: Yes. Medical problems stabilized or resolved: No. Denies suicidal/homicidal ideation: Yes. Issues/concerns per patient self-inventory: No. Other: NA  New problem(s) identified: No, Describe:  None reported  New Short Term/[place] Term Goal(s):Attend outpatient treatment, take medication as prescribed, develop and implement healthy coping methods  Patient Goals:  "make sure Im okay when I get out"  Discharge Plan or Barriers: Pt will return home and follow up with outpatient treatment  Reason for Continuation of Hospitalization: Medication stabilization  Estimated Length of Stay:1-7 days Recreational Therapy: Patient Stressors: Family, Friends Patient Goal: Patient will identify 3 positive coping skills strategies to use post d/c within 5 recreation therapy group sessions  Attendees: Patient:Erin Orr 10/29/2019 11:06 AM  Physician: Alethia Berthold 10/29/2019 11:06 AM  Nursing: Collier Bullock 10/29/2019 11:06 AM  RN Care Manager: 10/29/2019 11:06 AM  Social Worker: Sanjuana Kava Assunta Curtis 10/29/2019 11:06 AM  Recreational Therapist: Isaias Sakai Jeston Junkins 10/29/2019 11:06 AM  Other:  10/29/2019 11:06 AM  Other:  10/29/2019 11:06 AM  Other: 10/29/2019 11:06 AM    Scribe for Treatment Team: Yvette Rack,  LCSW 10/29/2019 11:06 AM

## 2019-10-29 NOTE — Plan of Care (Signed)
Pt rates depression 1/10. Pt denies anxiety, SI, HI and AVH. Pt was educated on care plan and verbalizes understanding. Pt was encouraged to attend groups. Torrie Mayers RN Problem: Education: Goal: Knowledge of [place] [facility] Education information/materials will improve Outcome: Progressing Goal: Emotional status will improve Outcome: Progressing Goal: Mental status will improve Outcome: Progressing Goal: Verbalization of understanding the information provided will improve Outcome: Progressing   Problem: Activity: Goal: Interest or engagement in activities will improve Outcome: Progressing Goal: Sleeping patterns will improve Outcome: Progressing   Problem: Coping: Goal: Ability to verbalize frustrations and anger appropriately will improve Outcome: Progressing Goal: Ability to demonstrate self-control will improve Outcome: Progressing   Problem: Health Behavior/Discharge Planning: Goal: Identification of resources available to assist in meeting health care needs will improve Outcome: Progressing Goal: Compliance with treatment plan for underlying cause of condition will improve Outcome: Progressing   Problem: Physical Regulation: Goal: Ability to maintain clinical measurements within normal limits will improve Outcome: Progressing   Problem: Safety: Goal: Periods of time without injury will increase Outcome: Progressing   Problem: Activity: Goal: Will verbalize the importance of balancing activity with adequate rest periods Outcome: Progressing   Problem: Education: Goal: Will be free of psychotic symptoms Outcome: Progressing Goal: Knowledge of the prescribed therapeutic regimen will improve Outcome: Progressing   Problem: Coping: Goal: Coping ability will improve Outcome: Progressing Goal: Will verbalize feelings Outcome: Progressing   Problem: Health Behavior/Discharge Planning: Goal: Compliance with prescribed medication regimen will improve Outcome:  Progressing   Problem: Nutritional: Goal: Ability to achieve adequate nutritional intake will improve Outcome: Progressing   Problem: Role Relationship: Goal: Ability to communicate needs accurately will improve Outcome: Progressing Goal: Ability to interact with others will improve Outcome: Progressing   Problem: Safety: Goal: Ability to redirect hostility and anger into socially appropriate behaviors will improve Outcome: Progressing Goal: Ability to remain free from injury will improve Outcome: Progressing   Problem: Self-Care: Goal: Ability to participate in self-care as condition permits will improve Outcome: Progressing   Problem: Self-Concept: Goal: Will verbalize positive feelings about self Outcome: Progressing

## 2019-10-29 NOTE — Plan of Care (Signed)
Pt rates depression 1/10. Pt denies anxiety, SI, HI and AVH. Pt was educated on care plan and verbalizes understanding. Pt was encouraged to attend groups. Collier Bullock RN Problem: Education: Goal: Knowledge of [place] [facility] Education information/materials will improve 10/29/2019 1458 by Kieth Brightly, RN Outcome: Progressing 10/29/2019 1450 by Kieth Brightly, RN Outcome: Progressing Goal: Emotional status will improve 10/29/2019 1458 by Kieth Brightly, RN Outcome: Progressing 10/29/2019 1450 by Kieth Brightly, RN Outcome: Progressing Goal: Mental status will improve 10/29/2019 1458 by Kieth Brightly, RN Outcome: Progressing 10/29/2019 1450 by Kieth Brightly, RN Outcome: Progressing Goal: Verbalization of understanding the information provided will improve 10/29/2019 1458 by Kieth Brightly, RN Outcome: Progressing 10/29/2019 1450 by Kieth Brightly, RN Outcome: Progressing   Problem: Activity: Goal: Interest or engagement in activities will improve 10/29/2019 1458 by Kieth Brightly, RN Outcome: Progressing 10/29/2019 1450 by Kieth Brightly, RN Outcome: Progressing Goal: Sleeping patterns will improve 10/29/2019 1458 by Kieth Brightly, RN Outcome: Progressing 10/29/2019 1450 by Kieth Brightly, RN Outcome: Progressing   Problem: Coping: Goal: Ability to verbalize frustrations and anger appropriately will improve 10/29/2019 1458 by Kieth Brightly, RN Outcome: Progressing 10/29/2019 1450 by Kieth Brightly, RN Outcome: Progressing Goal: Ability to demonstrate self-control will improve 10/29/2019 1458 by Kieth Brightly, RN Outcome: Progressing 10/29/2019 1450 by Kieth Brightly, RN Outcome: Progressing   Problem: Health Behavior/Discharge Planning: Goal: Identification of resources available to assist in meeting health care needs will improve 10/29/2019 1458 by Kieth Brightly, RN Outcome: Progressing 10/29/2019 1450 by Kieth Brightly, RN Outcome: Progressing Goal:  Compliance with treatment plan for underlying cause of condition will improve 10/29/2019 1458 by Kieth Brightly, RN Outcome: Progressing 10/29/2019 1450 by Kieth Brightly, RN Outcome: Progressing   Problem: Physical Regulation: Goal: Ability to maintain clinical measurements within normal limits will improve 10/29/2019 1458 by Kieth Brightly, RN Outcome: Progressing 10/29/2019 1450 by Kieth Brightly, RN Outcome: Progressing   Problem: Safety: Goal: Periods of time without injury will increase 10/29/2019 1458 by Kieth Brightly, RN Outcome: Progressing 10/29/2019 1450 by Kieth Brightly, RN Outcome: Progressing   Problem: Activity: Goal: Will verbalize the importance of balancing activity with adequate rest periods 10/29/2019 1458 by Kieth Brightly, RN Outcome: Progressing 10/29/2019 1450 by Kieth Brightly, RN Outcome: Progressing   Problem: Education: Goal: Will be free of psychotic symptoms 10/29/2019 1458 by Kieth Brightly, RN Outcome: Progressing 10/29/2019 1450 by Kieth Brightly, RN Outcome: Progressing Goal: Knowledge of the prescribed therapeutic regimen will improve 10/29/2019 1458 by Kieth Brightly, RN Outcome: Progressing 10/29/2019 1450 by Kieth Brightly, RN Outcome: Progressing   Problem: Coping: Goal: Coping ability will improve 10/29/2019 1458 by Kieth Brightly, RN Outcome: Progressing 10/29/2019 1450 by Kieth Brightly, RN Outcome: Progressing Goal: Will verbalize feelings 10/29/2019 1458 by Kieth Brightly, RN Outcome: Progressing 10/29/2019 1450 by Kieth Brightly, RN Outcome: Progressing   Problem: Health Behavior/Discharge Planning: Goal: Compliance with prescribed medication regimen will improve 10/29/2019 1458 by Kieth Brightly, RN Outcome: Progressing 10/29/2019 1450 by Kieth Brightly, RN Outcome: Progressing   Problem: Nutritional: Goal: Ability to achieve adequate nutritional intake will improve 10/29/2019 1458 by Kieth Brightly,  RN Outcome: Progressing 10/29/2019 1450 by Kieth Brightly, RN Outcome: Progressing   Problem: Role Relationship: Goal: Ability to communicate needs accurately will improve 10/29/2019 1458 by Kieth Brightly, RN Outcome: Progressing  10/29/2019 1450 by Chalmers Cater, RN Outcome: Progressing Goal: Ability to interact with others will improve 10/29/2019 1458 by Chalmers Cater, RN Outcome: Progressing 10/29/2019 1450 by Chalmers Cater, RN Outcome: Progressing   Problem: Safety: Goal: Ability to redirect hostility and anger into socially appropriate behaviors will improve 10/29/2019 1458 by Chalmers Cater, RN Outcome: Progressing 10/29/2019 1450 by Chalmers Cater, RN Outcome: Progressing Goal: Ability to remain free from injury will improve 10/29/2019 1458 by Chalmers Cater, RN Outcome: Progressing 10/29/2019 1450 by Chalmers Cater, RN Outcome: Progressing   Problem: Self-Care: Goal: Ability to participate in self-care as condition permits will improve 10/29/2019 1458 by Chalmers Cater, RN Outcome: Progressing 10/29/2019 1450 by Chalmers Cater, RN Outcome: Progressing   Problem: Self-Concept: Goal: Will verbalize positive feelings about self 10/29/2019 1458 by Chalmers Cater, RN Outcome: Progressing 10/29/2019 1450 by Chalmers Cater, RN Outcome: Progressing

## 2019-10-29 NOTE — [facility] Counselor (Signed)
CSW met with pt to discuss discharge plan. Pt declines referral for outpatient treatment but signed consent form to speak with her son.

## 2019-10-29 NOTE — Progress Notes (Signed)
Pt has been pleasant, calm and cooperative today. Pt has attended groups. Pt has ate more at meals.  Torrie Mayers RN

## 2019-10-29 NOTE — Progress Notes (Signed)
Recreation Therapy Notes  Date: 10/29/2019  Time: 9:30 am   Location: Craft room    Behavioral response: N/A   Intervention Topic: Problem-Solving   Discussion/Intervention: Patient did not attend group.   Clinical Observations/Feedback:  Patient did not attend group.   Erin Orr LRT/CTRS         Erin Orr 10/29/2019 10:56 AM

## 2019-10-29 NOTE — Progress Notes (Signed)
[facility] MD Progress Note  10/29/2019 6:39 PM Erin Orr  MRN:  [id number] Subjective: Follow-up for this patient who has what appears to be probably a relapsing psychosis such as schizoaffective or bipolar disorder.  Patient continues to eat very little and only certain vegetables and fruits.  When confronted about this she will deny it or rationalize it.  Patient continues to deny any past psychiatric history which is contradicted by the information her family has offered.  She appears somewhat withdrawn and paranoid but not frankly bizarre. Principal Problem: Acute psychosis ([facility]) Diagnosis: Principal Problem:   Acute psychosis ([facility]) Active Problems:   Hypertension   Lumbar radiculitis   Hot flashes   Delusion ([facility])  Total Time spent with patient: 30 minutes  Past Psychiatric History: Past history reportedly of psychiatric hospitalization although the patient denies it  Past Medical History:  Past Medical History:  Diagnosis Date  . Hypertension    History reviewed. No pertinent surgical history. Family History:  Family History  Problem Relation Age of Onset  . Heart disease Brother        has 3 brothers  . Heart attack Brother   . Colon cancer Neg Hx   . Breast cancer Neg Hx    Family Psychiatric  History: See previous Social History:  Social History   Substance and Sexual Activity  Alcohol Use Never     Social History   Substance and Sexual Activity  Drug Use Never    Social History   Socioeconomic History  . Marital status: Legally Separated    Spouse name: Not on file  . Number of children: Not on file  . Years of education: Not on file  . Highest education level: Not on file  Occupational History  . Not on file  Tobacco Use  . Smoking status: Never Smoker  . Smokeless tobacco: Never Used  Substance and Sexual Activity  . Alcohol use: Never  . Drug use: Never  . Sexual activity: Not on file  Other Topics Concern  . 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 Difficulty of Paying Living Expenses:   Food Insecurity:   . Worried About Programme researcher, broadcasting/film/video in the Last Year:   . Barista in the Last Year:   Transportation Needs:   . Freight forwarder (Medical):   Marland Kitchen Lack of Transportation (Non-Medical):   Physical Activity:   . Days of Exercise per Week:   . Minutes of Exercise per Session:   Stress:   . Feeling of Stress :   Social Connections:   . Frequency of Communication with Friends and Family:   . Frequency of Social Gatherings with Friends and Family:   . Attends Religious Services:   . Active Member of Clubs or Organizations:   . Attends Banker Meetings:   Marland Kitchen Marital Status:    Additional Social History:                         Sleep: Fair  Appetite:  Poor  Current Medications: Current Facility-Administered Medications  Medication Dose Route Frequency Provider Last Rate Last Admin  . acetaminophen (TYLENOL) tablet 650 mg  650 mg Oral Q6H PRN Dixon, Elray Buba, NP      . alum & mag hydroxide-simeth (MAALOX/MYLANTA)  200-200-20 MG/5ML suspension 30 mL  30 mL Oral Q4H PRN Dixon, Rashaun M, NP      . amLODipine (NORVASC) tablet 10 mg  10 mg Oral Daily Allegra Cerniglia, Madie Reno, MD   10 mg at 10/29/19 0759  . diphenhydrAMINE (BENADRYL) capsule 25 mg  25 mg Oral Q6H PRN Deloria Lair, NP   25 mg at 10/27/19 2147  . gabapentin (NEURONTIN) capsule 100 mg  100 mg Oral BID BM & HS PRN [name], MD   100 mg at 10/27/19 2147  . LORazepam (ATIVAN) tablet 1 mg  1 mg Oral Q4H PRN Dixon, Rashaun M, NP      . magnesium hydroxide (MILK OF MAGNESIA) suspension 30 mL  30 mL Oral Daily PRN Dixon, Rashaun M, NP      . metoprolol succinate (TOPROL-XL) 24 hr tablet 100 mg  100 mg Oral Daily Dixon, Rashaun M, NP   100 mg at 10/29/19 0759  . paliperidone (INVEGA) 24 hr tablet 6 mg  6 mg  Oral QHS Braysen Cloward T, MD      . traZODone (DESYREL) tablet 50 mg  50 mg Oral QHS PRN Deloria Lair, NP   50 mg at 10/27/19 2147    Lab Results: No results found for this or any previous visit (from the past 48 hour(s)).  Blood Alcohol level:  Lab Results  Component Value Date   ETH <10 10/25/2019   ETH <5 35/36/1443    Metabolic Disorder Labs: Lab Results  Component Value Date   HGBA1C 5.7 09/10/2019   No results found for: PROLACTIN Lab Results  Component Value Date   CHOL 168 09/10/2019   TRIG 100.0 09/10/2019   HDL 47.70 09/10/2019   CHOLHDL 4 09/10/2019   VLDL 20.0 09/10/2019   LDLCALC 100 (H) 09/10/2019    Physical Findings: AIMS: Facial and Oral Movements Muscles of Facial Expression: None, normal Lips and Perioral Area: None, normal Jaw: None, normal Tongue: None, normal,Extremity Movements Upper (arms, wrists, hands, fingers): None, normal Lower (legs, knees, ankles, toes): None, normal, Trunk Movements Neck, shoulders, hips: None, normal, Overall Severity Severity of abnormal movements (highest score from questions above): None, normal Incapacitation due to abnormal movements: None, normal Patient's awareness of abnormal movements (rate only patient's report): No Awareness, Dental Status Current problems with teeth and/or dentures?: No Does patient usually wear dentures?: No  CIWA:    COWS:     Musculoskeletal: Strength & Muscle Tone: decreased Gait & Station: normal Patient leans: N/A  Psychiatric Specialty Exam: Physical Exam  Nursing note and vitals reviewed. Constitutional: She appears well-developed and well-nourished.  HENT:  Head: Normocephalic and atraumatic.  Eyes: Pupils are equal, round, and reactive to light. Conjunctivae are normal.  Cardiovascular: Regular rhythm and normal heart sounds.  Respiratory: Effort normal.  GI: Soft.  Musculoskeletal:        General: Normal range of motion.     Cervical back: Normal range of  motion.  Neurological: She is alert.  Skin: Skin is warm and dry.  Psychiatric: Her affect is blunt. Her speech is delayed. She is slowed and withdrawn. Thought content is paranoid. Cognition and memory are impaired. She expresses inappropriate judgment. She expresses no homicidal and no suicidal ideation.    Review of Systems  Constitutional: Negative.   HENT: Negative.   Eyes: Negative.   Respiratory: Negative.   Cardiovascular: Negative.   Gastrointestinal: Negative.   Musculoskeletal: Negative.   Skin: Negative.   Neurological: Negative.  Psychiatric/Behavioral: Negative.     Blood pressure (!) 137/105, pulse (!) 113, temperature (!) 97.4 F (36.3 C), temperature source Oral, resp. rate 18, height 5\' 6"  (1.676 m), weight 86.2 kg, SpO2 99 %.Body mass index is 30.67 kg/m.  General Appearance: Casual  Eye Contact:  Fair  Speech:  Slow  Volume:  Decreased  Mood:  Anxious  Affect:  Constricted  Thought Process:  Coherent  Orientation:  Full (Time, Place, and Person)  Thought Content:  Paranoid Ideation and Rumination  Suicidal Thoughts:  No  Homicidal Thoughts:  No  Memory:  Immediate;   Fair Recent;   Poor Remote;   Poor  Judgement:  Impaired  Insight:  Shallow  Psychomotor Activity:  Decreased  Concentration:  Concentration: Poor  Recall:  of Knowledge:  Fair  Language:  Fair  Akathisia:  No  Handed:  Right  AIMS (if indicated):     Assets:  Housing Physical Health  ADL's:  Intact  Cognition:  WNL  Sleep:  Number of Hours: 8     Treatment Plan Summary: Daily contact with patient to assess and evaluate symptoms and progress in treatment, Medication management and Plan Patient superficially stable although continues to be paranoid in her behavior.  I advised her this evening that we would start her on antipsychotic medicine specifically Invega.  She said she would take it even though she did not think there was anything wrong with her.  Side effects and  risks and benefits discussed.  Start 6 mg Invega at night.  Continue to encourage her to eat better.  [employer], MD 10/29/2019, 6:39 PM

## 2019-10-29 NOTE — Progress Notes (Signed)
Recreation Therapy Notes  INPATIENT RECREATION THERAPY ASSESSMENT  Patient Details Name: Erin Orr MRN: [id number] DOB: May 15, 1961 Today's Date: 10/29/2019       Information Obtained From: Patient  Able to Participate in Assessment/Interview: Yes  Patient Presentation: Responsive  Reason for Admission (Per Patient): Active Symptoms  Patient Stressors: Relationship  Coping Skills:   Talk, Other (Comment)(Travel)  Leisure Interests (2+):  Community - Travel (Comment), Nature - Geographical information systems officer, Social - Friends  Frequency of Recreation/Participation: Pharmacist, community Resources:  Yes  Community Resources:  [place]  Current Use: Yes  If no, Barriers?:    Expressed Interest in [employer] Information:    [place] of Residence:  Film/video editor  Patient Main Form of Transportation: Set designer  Patient Strengths:  People person,  I love life  Patient Identified Areas of Improvement:  Being nicer to people  Patient Goal for Hospitalization:  Pick my head up and look towards my future.  Current SI (including self-harm):  No  Current HI:  No  Current AVH: No  Staff Intervention Plan: Group Attendance, Collaborate with Interdisciplinary Treatment Team  Consent to Intern Participation: N/A  Kyera Felan 10/29/2019, 12:32 PM

## 2019-10-30 NOTE — Progress Notes (Signed)
Patient is pleasant and cooperative. Has been more  isolative on the unit this day. She denied SI/HI/AVH anxiety and depression. Received prescribed meds and tolerated without incident. Remains safe on the unit with 15 minute safety checks.

## 2019-10-30 NOTE — Progress Notes (Signed)
Operating Room Services MD Progress Note  10/30/2019 3:10 PM Erin Orr  MRN:  [id number] Subjective: Follow-up for this patient with history of psychotic behavior.  Today I spoke with her husband on the telephone.  He told me the same history that the son had repeated that the patient has multiple psychotic behaviors outside the hospital impairing her function.  Patient apparently did take medication last night.  On interview today appears unchanged.  Still denies psychotic symptoms.  She is rambling and tangential and extremely vague in conversation but has not been threatening or violent. Principal Problem: Acute psychosis ([place]) Diagnosis: Principal Problem:   Acute psychosis ([place]) Active Problems:   Hypertension   Lumbar radiculitis   Hot flashes   Delusion ([place])  Total Time spent with patient: 30 minutes  Past Psychiatric History: Patient has a reported history of previous hospitalizations for psychotic symptoms although none of them are in the electronic record  Past Medical History:  Past Medical History:  Diagnosis Date  . Hypertension    History reviewed. No pertinent surgical history. Family History:  Family History  Problem Relation Age of Onset  . Heart disease Brother        has 3 brothers  . Heart attack Brother   . Colon cancer Neg Hx   . Breast cancer Neg Hx    Family Psychiatric  History: See previous Social History:  Social History   Substance and Sexual Activity  Alcohol Use Never     Social History   Substance and Sexual Activity  Drug Use Never    Social History   Socioeconomic History  . Marital status: Legally Separated    Spouse name: Not on file  . Number of children: Not on file  . Years of education: Not on file  . Highest education level: Not on file  Occupational History  . Not on file  Tobacco Use  . Smoking status: Never Smoker  . Smokeless tobacco: Never Used  Substance and Sexual Activity  . Alcohol use: Never  . Drug use: Never  .  Sexual activity: Not on file  Other Topics Concern  . 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 Difficulty of Paying Living Expenses:   Food Insecurity:   . Worried About Charity fundraiser in the Last Year:   . Arboriculturist in the Last Year:   Transportation Needs:   . Film/video editor (Medical):   Marland Kitchen Lack of Transportation (Non-Medical):   Physical Activity:   . Days of Exercise per Week:   . Minutes of Exercise per Session:   Stress:   . Feeling of Stress :   Social Connections:   . Frequency of Communication with Friends and Family:   . Frequency of Social Gatherings with Friends and Family:   . Attends Religious Services:   . Active Member of Clubs or Organizations:   . Attends Archivist Meetings:   Marland Kitchen Marital Status:    Additional Social History:                         Sleep: Fair  Appetite:  Fair  Current Medications: Current Facility-Administered Medications  Medication Dose Route Frequency Provider Last Rate Last Admin  . acetaminophen (TYLENOL) tablet 650 mg  650 mg Oral Q6H PRN  Jearld Lesch, NP      . alum & mag hydroxide-simeth (MAALOX/MYLANTA) 200-200-20 MG/5ML suspension 30 mL  30 mL Oral Q4H PRN Dixon, Rashaun M, NP      . amLODipine (NORVASC) tablet 10 mg  10 mg Oral Daily Daelynn Blower, Jackquline Denmark, MD   10 mg at 10/30/19 0753  . diphenhydrAMINE (BENADRYL) capsule 25 mg  25 mg Oral Q6H PRN Jearld Lesch, NP   25 mg at 10/27/19 2147  . gabapentin (NEURONTIN) capsule 100 mg  100 mg Oral BID BM & HS PRN Mariel Craft, MD   100 mg at 10/27/19 2147  . LORazepam (ATIVAN) tablet 1 mg  1 mg Oral Q4H PRN Dixon, Rashaun M, NP      . magnesium hydroxide (MILK OF MAGNESIA) suspension 30 mL  30 mL Oral Daily PRN Dixon, Rashaun M, NP      . metoprolol succinate (TOPROL-XL) 24 hr tablet 100 mg  100 mg Oral Daily Dixon, Rashaun M, NP    100 mg at 10/30/19 0753  . paliperidone (INVEGA) 24 hr tablet 6 mg  6 mg Oral QHS Langston Summerfield T, MD   6 mg at 10/29/19 2120  . traZODone (DESYREL) tablet 50 mg  50 mg Oral QHS PRN Jearld Lesch, NP   50 mg at 10/29/19 2120    Lab Results: No results found for this or any previous visit (from the past 48 hour(s)).  Blood Alcohol level:  Lab Results  Component Value Date   ETH <10 10/25/2019   ETH <5 09/23/2016    Metabolic Disorder Labs: Lab Results  Component Value Date   HGBA1C 5.7 09/10/2019   No results found for: PROLACTIN Lab Results  Component Value Date   CHOL 168 09/10/2019   TRIG 100.0 09/10/2019   HDL 47.70 09/10/2019   CHOLHDL 4 09/10/2019   VLDL 20.0 09/10/2019   LDLCALC 100 (H) 09/10/2019    Physical Findings: AIMS: Facial and Oral Movements Muscles of Facial Expression: None, normal Lips and Perioral Area: None, normal Jaw: None, normal Tongue: None, normal,Extremity Movements Upper (arms, wrists, hands, fingers): None, normal Lower (legs, knees, ankles, toes): None, normal, Trunk Movements Neck, shoulders, hips: None, normal, Overall Severity Severity of abnormal movements (highest score from questions above): None, normal Incapacitation due to abnormal movements: None, normal Patient's awareness of abnormal movements (rate only patient's report): No Awareness, Dental Status Current problems with teeth and/or dentures?: No Does patient usually wear dentures?: No  CIWA:    COWS:     Musculoskeletal: Strength & Muscle Tone: within normal limits Gait & Station: normal Patient leans: N/A  Psychiatric Specialty Exam: Physical Exam  Nursing note and vitals reviewed. Constitutional: She appears well-developed and well-nourished.  HENT:  Head: Normocephalic and atraumatic.  Eyes: Pupils are equal, round, and reactive to light. Conjunctivae are normal.  Cardiovascular: Regular rhythm and normal heart sounds.  Respiratory: Effort normal. No  respiratory distress.  GI: Soft.  Musculoskeletal:        General: Normal range of motion.     Cervical back: Normal range of motion.  Neurological: She is alert.  Skin: Skin is warm and dry.  Psychiatric: Her affect is blunt. Her speech is tangential. She is slowed. Thought content is paranoid. Cognition and memory are impaired. She expresses inappropriate judgment. She expresses no homicidal and no suicidal ideation.    Review of Systems  Constitutional: Negative.   HENT: Negative.   Eyes: Negative.   Respiratory: Negative.  Cardiovascular: Negative.   Gastrointestinal: Negative.   Musculoskeletal: Negative.   Skin: Negative.   Neurological: Negative.   Psychiatric/Behavioral: Negative.     Blood pressure (!) 156/105, pulse (!) 104, temperature (!) 97.4 F (36.3 C), temperature source Oral, resp. rate 18, height 5\' 6"  (1.676 m), weight 86.2 kg, SpO2 100 %.Body mass index is 30.67 kg/m.  General Appearance: Fairly Groomed  Eye Contact:  Fair  Speech:  Slow  Volume:  Decreased  Mood:  Euthymic  Affect:  Constricted  Thought Process:  Disorganized  Orientation:  Full (Time, Place, and Person)  Thought Content:  Illogical and Tangential  Suicidal Thoughts:  No  Homicidal Thoughts:  No  Memory:  Immediate;   Fair Recent;   Poor Remote;   Poor  Judgement:  Impaired  Insight:  Lacking  Psychomotor Activity:  Restlessness  Concentration:  Concentration: Poor  Recall:  Fair  Fund of Knowledge:  Fair  Language:  Fair  Akathisia:  No  Handed:  Right  AIMS (if indicated):     Assets:  Physical Health Resilience Social Support  ADL's:  Intact  Cognition:  WNL  Sleep:  Number of Hours: 2.45     Treatment Plan Summary: Daily contact with patient to assess and evaluate symptoms and progress in treatment, Medication management and Plan Continue 6 mg of Invega at night.  Tried to form some rapport with the patient.  Told the patient's husband that I understood his concerns  and what he was reporting but that it may be that with a diagnosis of either delusional disorder or schizoaffective disorder that it will be difficult to get much improvement in the patient in a short time.  Encourage patient in group attendance.  , MD 10/30/2019, 3:10 PM

## 2019-10-30 NOTE — Plan of Care (Signed)
  Problem: Education: Goal: Knowledge of [facility] Education information/materials will improve Outcome: Progressing Goal: Emotional status will improve Outcome: Progressing Goal: Mental status will improve Outcome: Progressing   Problem: Safety: Goal: Periods of time without injury will increase Outcome: Progressing   

## 2019-10-30 NOTE — [facility] Suicide Risk Assessment (Signed)
[facility] INPATIENT:  Family/Significant Other Suicide Prevention Education  Suicide Prevention Education:  Education Completed; Erin Orr, son [phone] has been identified by the patient as the family member/significant other with whom the patient will be residing, and identified as the person(s) who will aid the patient in the event of a mental health crisis (suicidal ideations/suicide attempt).  With written consent from the patient, the family member/significant other has been provided the following suicide prevention education, prior to the and/or following the discharge of the patient.  The suicide prevention education provided includes the following:  Suicide risk factors  Suicide prevention and interventions  National Suicide Hotline telephone number  [facility] assessment telephone number  [facility] Emergency Assistance 911  [facility] and/or Residential Mobile Crisis Unit telephone number  Request made of family/significant other to:  Remove weapons (e.g., guns, rifles, knives), all items previously/currently identified as safety concern.    Remove drugs/medications (over-the-counter, prescriptions, illicit drugs), all items previously/currently identified as a safety concern.  The family member/significant other verbalizes understanding of the suicide prevention education information provided.  The family member/significant other agrees to remove the items of safety concern listed above. Mr. Erin Orr reports the pt was brought to the hospital out of concern for the changes in her behavior. 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 Mr. Erin Orr states, three yrs ago, the pt left the home, did not notify anyone and traveled to [place]. He states  she was found passed out on someone's lawn and traveled there "to meet a rich white man." He denies the pt having access to guns or weapons in the home.   Erin Orr Erin Orr 10/30/2019, 3:22 PM

## 2019-10-30 NOTE — Progress Notes (Signed)
Recreation Therapy Notes  Date: 10/30/2019  Time: 9:30 am   Location: Craft room    Behavioral response: N/A   Intervention Topic: Relaxation    Discussion/Intervention: Patient did not attend group.   Clinical Observations/Feedback:  Patient did not attend group.   Morgon Pamer LRT/CTRS         Lorane Cousar 10/30/2019 10:44 AM

## 2019-10-30 NOTE — Plan of Care (Signed)
Pt rates hopelessness 1/10. Pt denies depression, anxiety, SI, HI and AVH. Pt was educated on care plan and verbalizes understanding. Pt was educated on care plan and verbalizes understanding. Torrie Mayers RN Problem: Education: Goal: Knowledge of [place] [facility] Education information/materials will improve Outcome: Progressing Goal: Emotional status will improve Outcome: Progressing Goal: Mental status will improve Outcome: Progressing Goal: Verbalization of understanding the information provided will improve Outcome: Progressing   Problem: Activity: Goal: Interest or engagement in activities will improve Outcome: Progressing Goal: Sleeping patterns will improve Outcome: Progressing   Problem: Coping: Goal: Ability to verbalize frustrations and anger appropriately will improve Outcome: Progressing Goal: Ability to demonstrate self-control will improve Outcome: Progressing   Problem: Coping: Goal: Ability to verbalize frustrations and anger appropriately will improve Outcome: Progressing Goal: Ability to demonstrate self-control will improve Outcome: Progressing   Problem: Health Behavior/Discharge Planning: Goal: Identification of resources available to assist in meeting health care needs will improve Outcome: Progressing Goal: Compliance with treatment plan for underlying cause of condition will improve Outcome: Progressing   Problem: Physical Regulation: Goal: Ability to maintain clinical measurements within normal limits will improve Outcome: Progressing   Problem: Safety: Goal: Periods of time without injury will increase Outcome: Progressing   Problem: Activity: Goal: Will verbalize the importance of balancing activity with adequate rest periods Outcome: Progressing   Problem: Education: Goal: Will be free of psychotic symptoms Outcome: Progressing Goal: Knowledge of the prescribed therapeutic regimen will improve Outcome: Progressing   Problem:  Coping: Goal: Coping ability will improve Outcome: Progressing Goal: Will verbalize feelings Outcome: Progressing   Problem: Health Behavior/Discharge Planning: Goal: Compliance with prescribed medication regimen will improve Outcome: Progressing   Problem: Nutritional: Goal: Ability to achieve adequate nutritional intake will improve Outcome: Progressing   Problem: Role Relationship: Goal: Ability to communicate needs accurately will improve Outcome: Progressing Goal: Ability to interact with others will improve Outcome: Progressing   Problem: Safety: Goal: Ability to redirect hostility and anger into socially appropriate behaviors will improve Outcome: Progressing Goal: Ability to remain free from injury will improve Outcome: Progressing   Problem: Self-Care: Goal: Ability to participate in self-care as condition permits will improve Outcome: Progressing   Problem: Self-Concept: Goal: Will verbalize positive feelings about self Outcome: Progressing

## 2019-10-30 NOTE — Progress Notes (Signed)
Patient pleasant and cooperative. Denies SI, HI, AVH. Forwards minimal. Isolative to self and room. Comes out for meds. Medication compliant. Encouragement and support provided. Safety checks maintained. Medications given as prescribed. Pt receptive and remains safe on unit with q 15 min checks.

## 2019-10-30 NOTE — Progress Notes (Signed)
Pt states she is "good". Pt did not attend groups but stays mostly in her room. Pt says she "ate more... I ate what is good, a salad and fruit". Pt was excited that she spoke with her son on the phone. Pt also says that she has never been in a psychiatric hospital before "never". Pt is very pleasant and grateful for any care. Torrie Mayers RN

## 2019-10-30 NOTE — [facility] Group Notes (Signed)
Emotional Regulation 10/30/2019 9:30AM/1PM  Type of Therapy/Topic:  Group Therapy:  Emotion Regulation  Participation Level:  Did Not Attend   Description of Group:   The purpose of this group is to assist patients in learning to regulate negative emotions and experience positive emotions. Patients will be guided to discuss ways in which they have been vulnerable to their negative emotions. These vulnerabilities will be juxtaposed with experiences of positive emotions or situations, and patients will be challenged to use positive emotions to combat negative ones. Special emphasis will be placed on coping with negative emotions in conflict situations, and patients will process healthy conflict resolution skills.  Therapeutic Goals: 1. Patient will identify two positive emotions or experiences to reflect on in order to balance out negative emotions 2. Patient will label two or more emotions that they find the most difficult to experience 3. Patient will demonstrate positive conflict resolution skills through discussion and/or role plays  Summary of Patient Progress:       Therapeutic Modalities:   Cognitive Behavioral Therapy Feelings Identification Dialectical Behavioral Therapy   Suzan Slick, LCSW 10/30/2019 1:56 PM

## 2019-10-31 MED ORDER — PALIPERIDONE ER 3 MG PO TB24
9.0000 mg | ORAL_TABLET | Freq: Every day | ORAL | Status: DC
  Administered 2019-10-31 – 2019-11-03 (×4): 9 mg via ORAL
  Filled 2019-10-31 (×4): qty 3

## 2019-10-31 NOTE — [facility] Group Notes (Signed)
LCSW Group Therapy Note  10/31/2019 1:00 PM  Type of Therapy/Topic:  Group Therapy:  Balance in Life  Participation Level:  Active  Description of Group:    This group will address the concept of balance and how it feels and looks when one is unbalanced. Patients will be encouraged to process areas in their lives that are out of balance and identify reasons for remaining unbalanced. Facilitators will guide patients in utilizing problem-solving interventions to address and correct the stressor making their life unbalanced. Understanding and applying boundaries will be explored and addressed for obtaining and maintaining a balanced life. Patients will be encouraged to explore ways to assertively make their unbalanced needs known to significant others in their lives, using other group members and facilitator for support and feedback.  Therapeutic Goals: 1. Patient will identify two or more emotions or situations they have that consume much of in their lives. 2. Patient will identify signs/triggers that life has become out of balance:  3. Patient will identify two ways to set boundaries in order to achieve balance in their lives:  4. Patient will demonstrate ability to communicate their needs through discussion and/or role plays  Summary of Patient Progress: Patient was present and active in group.  Patient shared that she has used deep breathing as a way of deescalating when she feels anxious.  She was attentive in group and participated in group discussions.   Therapeutic Modalities:   Cognitive Behavioral Therapy Solution-Focused Therapy Assertiveness Training  Penni Homans MSW, LCSW 10/31/2019 12:50 PM

## 2019-10-31 NOTE — Progress Notes (Signed)
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  Patient continues to deny any symptoms in interview.  Denies hallucinations denies any kind of delusional thought.  When confronted about some of the delusions reported by her family she will deny or change the subject.  Patient is insisting that she is eating healthfully.  Denies suicidal or homicidal ideation.  Apparently still taking medicine without any side effects.  No aggressive behavior on the unit.  Self-care is still passable.  She continues to state that she is not going to stay with her husband after discharge because she insists that he no longer lives at their home. Principal Problem: Acute psychosis (HCC) Diagnosis: Principal Problem:   Acute psychosis (HCC) Active Problems:   Hypertension   Lumbar radiculitis   Hot flashes   Delusion (HCC)  Total Time spent with patient: 30 minutes  Past Psychiatric History: Patient has a past history of recurrent episodes of psychosis by family report  Past Medical History:  Past Medical History:  Diagnosis Date  . Hypertension    History reviewed. No pertinent surgical history. Family History:  Family History  Problem Relation Age of Onset  . Heart disease Brother        has 3 brothers  . Heart attack Brother   . Colon cancer Neg Hx   . Breast cancer Neg Hx    Family Psychiatric  History: See previous Social History:  Social History   Substance and Sexual Activity  Alcohol Use Never     Social History   Substance and Sexual Activity  Drug Use Never    Social History   Socioeconomic History  . Marital status: Legally Separated    Spouse name: Not on file  . Number of children: Not on file  . Years of education: Not on file  . Highest education level: Not on file  Occupational History  .  Not on file  Tobacco Use  . Smoking status: Never Smoker  . Smokeless tobacco: Never Used  Substance and Sexual Activity  . Alcohol use: Never  . Drug use: Never  . Sexual activity: Not on file  Other Topics Concern  . 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 Difficulty of Paying Living Expenses:   Food Insecurity:   . Worried About Programme researcher, broadcasting/film/video in the Last Year:   . Barista in the Last Year:   Transportation Needs:   . Freight forwarder (Medical):   Marland Kitchen Lack of Transportation (Non-Medical):   Physical Activity:   . Days of Exercise per Week:   . Minutes of Exercise per Session:   Stress:   . Feeling of Stress :   Social Connections:   . Frequency of Communication with Friends and Family:   . Frequency of Social Gatherings with Friends and Family:   . Attends Religious Services:   . Active Member of Clubs or Organizations:   . Attends Banker Meetings:   Marland Kitchen Marital Status:    Additional Social History:                         Sleep: Fair  Appetite:  Fair  Current Medications: Current Facility-Administered Medications  Medication Dose Route Frequency Provider Last Rate Last Admin  . acetaminophen (TYLENOL) tablet 650 mg  650 mg Oral Q6H PRN Dixon, Rashaun M, NP      . alum & mag hydroxide-simeth (MAALOX/MYLANTA) 200-200-20 MG/5ML suspension 30 mL  30 mL Oral Q4H PRN Dixon, Rashaun M, NP      . amLODipine (NORVASC) tablet 10 mg  10 mg Oral Daily Esti Demello, Madie Reno, MD   10 mg at 10/31/19 0759  . diphenhydrAMINE (BENADRYL) capsule 25 mg  25 mg Oral Q6H PRN Deloria Lair, NP   25 mg at 10/27/19 2147  . gabapentin (NEURONTIN) capsule 100 mg  100 mg Oral BID BM & HS PRN [name], MD   100 mg at 10/27/19 2147  . LORazepam (ATIVAN) tablet 1 mg  1 mg Oral Q4H PRN Dixon, Rashaun M, NP      . magnesium hydroxide (MILK OF  MAGNESIA) suspension 30 mL  30 mL Oral Daily PRN Dixon, Rashaun M, NP      . metoprolol succinate (TOPROL-XL) 24 hr tablet 100 mg  100 mg Oral Daily Dixon, Rashaun M, NP   100 mg at 10/31/19 0759  . paliperidone (INVEGA) 24 hr tablet 6 mg  6 mg Oral QHS Diya Gervasi, Madie Reno, MD   6 mg at 10/30/19 2111  . traZODone (DESYREL) tablet 50 mg  50 mg Oral QHS PRN Deloria Lair, NP   50 mg at 10/29/19 2120    Lab Results: No results found for this or any previous visit (from the past 48 hour(s)).  Blood Alcohol level:  Lab Results  Component Value Date   ETH <10 10/25/2019   ETH <5 76/28/3151    Metabolic Disorder Labs: Lab Results  Component Value Date   HGBA1C 5.7 09/10/2019   No results found for: PROLACTIN Lab Results  Component Value Date   CHOL 168 09/10/2019   TRIG 100.0 09/10/2019   HDL 47.70 09/10/2019   CHOLHDL 4 09/10/2019   VLDL 20.0 09/10/2019   LDLCALC 100 (H) 09/10/2019    Physical Findings: AIMS: Facial and Oral Movements Muscles of Facial Expression: None, normal Lips and Perioral Area: None, normal Jaw: None, normal Tongue: None, normal,Extremity Movements Upper (arms, wrists, hands, fingers): None, normal Lower (legs, knees, ankles, toes): None, normal, Trunk Movements Neck, shoulders, hips: None, normal, Overall Severity Severity of abnormal movements (highest score from questions above): None, normal Incapacitation due to abnormal movements: None, normal Patient's awareness of abnormal movements (rate only patient's report): No Awareness, Dental Status Current problems with teeth and/or dentures?: No Does patient usually wear dentures?: No  CIWA:    COWS:     Musculoskeletal: Strength & Muscle Tone: within normal limits Gait & Station: normal Patient leans: N/A  Psychiatric Specialty Exam: Physical Exam  Nursing note and vitals reviewed. Constitutional: She appears well-developed and well-nourished.  HENT:  Head: Normocephalic and atraumatic.   Eyes: Pupils are equal, round, and reactive to light. Conjunctivae are normal.  Cardiovascular: Regular rhythm and normal heart sounds.  Respiratory: Effort normal. No respiratory distress.  GI: Soft.  Musculoskeletal:        General: Normal range of motion.     Cervical back: Normal range of motion.  Neurological: She is alert.  Skin: Skin is warm and dry.  Psychiatric: Her affect is blunt. Her speech is delayed and tangential. She is not agitated and not aggressive. Thought content is paranoid and delusional. Cognition  and memory are impaired. She expresses inappropriate judgment. She expresses no homicidal and no suicidal ideation.    Review of Systems  Constitutional: Negative.   HENT: Negative.   Eyes: Negative.   Respiratory: Negative.   Cardiovascular: Negative.   Gastrointestinal: Negative.   Musculoskeletal: Negative.   Skin: Negative.   Neurological: Negative.   Psychiatric/Behavioral: Negative.     Blood pressure (!) 152/95, pulse 97, temperature 98.5 F (36.9 C), temperature source Oral, resp. rate 18, height 5\' 6"  (1.676 m), weight 86.2 kg, SpO2 99 %.Body mass index is 30.67 kg/m.  General Appearance: Casual  Eye Contact:  Minimal  Speech:  Slow  Volume:  Decreased  Mood:  Euthymic  Affect:  Constricted  Thought Process:  Disorganized  Orientation:  Full (Time, Place, and Person)  Thought Content:  Illogical and Rumination  Suicidal Thoughts:  No  Homicidal Thoughts:  No  Memory:  Immediate;   Fair Recent;   Fair Remote;   Poor  Judgement:  Impaired  Insight:  Shallow  Psychomotor Activity:  Decreased  Concentration:  Concentration: Fair  Recall:  Poor  Fund of Knowledge:  Fair  Language:  Fair  Akathisia:  No  Handed:  Right  AIMS (if indicated):     Assets:  Housing Physical Health Resilience  ADL's:  Impaired  Cognition:  Impaired,  Mild  Sleep:  Number of Hours: 7.5     Treatment Plan Summary: Daily contact with patient to assess and  evaluate symptoms and progress in treatment, Medication management and Plan Patient herself continues to steadfastly denies symptoms.  She is not acting bizarrely although she still has a constricted affect and a distant look about her and is very vague in her history.  I tend to believe that the family's history is probably correct.  Patient is from what we can tell taking her Invega although having no side effects and no clear change.  It would be difficult to know how we can assess any improvement in the hospital stay since she is denying symptoms.  Patient's will be increased today.  Anticipate probably giving her the Brenn-acting injection tomorrow.  May be looking at discharge in 1 to 2 days.  Hinda Glatter, MD 10/31/2019, 2:44 PM

## 2019-10-31 NOTE — Progress Notes (Signed)
Pt remains pleasant. Pt has gotten out of her room more than yesterday. She went to the dayroom other than just for meals when I gave her the option. Pt does best when prompted to come out of her room. Now she is back in her room resting.  Torrie Mayers RN

## 2019-10-31 NOTE — Plan of Care (Signed)
Pt rates anxiety 1/10. Pt denies depression, SI, HI and AVH. Pt was educated on care plan and verbalizes understanding. Pt was encouraged to attend groups and go outside. Erin Mayers RN Problem: Education: Goal: Knowledge of [facility] Education information/materials will improve Outcome: Progressing Goal: Emotional status will improve Outcome: Progressing Goal: Mental status will improve Outcome: Progressing Goal: Verbalization of understanding the information provided will improve Outcome: Progressing   Problem: Activity: Goal: Interest or engagement in activities will improve Outcome: Progressing Goal: Sleeping patterns will improve Outcome: Progressing   Problem: Coping: Goal: Ability to verbalize frustrations and anger appropriately will improve Outcome: Progressing Goal: Ability to demonstrate self-control will improve Outcome: Progressing   Problem: Health Behavior/Discharge Planning: Goal: Identification of resources available to assist in meeting health care needs will improve Outcome: Progressing Goal: Compliance with treatment plan for underlying cause of condition will improve Outcome: Progressing   Problem: Physical Regulation: Goal: Ability to maintain clinical measurements within normal limits will improve Outcome: Progressing   Problem: Safety: Goal: Periods of time without injury will increase Outcome: Progressing   Problem: Activity: Goal: Will verbalize the importance of balancing activity with adequate rest periods Outcome: Progressing   Problem: Education: Goal: Will be free of psychotic symptoms Outcome: Progressing Goal: Knowledge of the prescribed therapeutic regimen will improve Outcome: Progressing   Problem: Coping: Goal: Coping ability will improve Outcome: Progressing Goal: Will verbalize feelings Outcome: Progressing   Problem: Health Behavior/Discharge Planning: Goal: Compliance with prescribed medication regimen will  improve Outcome: Progressing   Problem: Nutritional: Goal: Ability to achieve adequate nutritional intake will improve Outcome: Progressing   Problem: Role Relationship: Goal: Ability to communicate needs accurately will improve Outcome: Progressing Goal: Ability to interact with others will improve Outcome: Progressing   Problem: Safety: Goal: Ability to redirect hostility and anger into socially appropriate behaviors will improve Outcome: Progressing Goal: Ability to remain free from injury will improve Outcome: Progressing   Problem: Self-Care: Goal: Ability to participate in self-care as condition permits will improve Outcome: Progressing   Problem: Self-Concept: Goal: Will verbalize positive feelings about self Outcome: Progressing

## 2019-11-01 MED ORDER — LISINOPRIL 5 MG PO TABS
5.0000 mg | ORAL_TABLET | Freq: Every day | ORAL | Status: DC
  Administered 2019-11-01 – 2019-11-04 (×3): 5 mg via ORAL
  Filled 2019-11-01 (×5): qty 1

## 2019-11-01 NOTE — [facility] Group Notes (Signed)
Balance In Life 11/01/2019 9:30AM/1PM  Type of Therapy/Topic:  Group Therapy:  Balance in Life  Participation Level:  Did Not Attend  Description of Group:   This group will address the concept of balance and how it feels and looks when one is unbalanced. Patients will be encouraged to process areas in their lives that are out of balance and identify reasons for remaining unbalanced. Facilitators will guide patients in utilizing problem-solving interventions to address and correct the stressor making their life unbalanced. Understanding and applying boundaries will be explored and addressed for obtaining and maintaining a balanced life. Patients will be encouraged to explore ways to assertively make their unbalanced needs known to significant others in their lives, using other group members and facilitator for support and feedback.  Therapeutic Goals: 1. Patient will identify two or more emotions or situations they have that consume much of in their lives. 2. Patient will identify signs/triggers that life has become out of balance:  3. Patient will identify two ways to set boundaries in order to achieve balance in their lives:  4. Patient will demonstrate ability to communicate their needs through discussion and/or role plays  Summary of Patient Progress:    Therapeutic Modalities:   Cognitive Behavioral Therapy Solution-Focused Therapy Assertiveness Training  Erin Orr Tamsen Reist, LCSW  

## 2019-11-01 NOTE — Progress Notes (Signed)
Care taken over at 1000. Patient in dayroom watching tv in no distress. Denies any SI, HI, AVH. Pt pleasant and cooperative. Currently in dayroom watching tv. Will continue to monitor for safety with q 15 min checks.

## 2019-11-01 NOTE — Progress Notes (Signed)
Recreation Therapy Notes   Date: 11/01/2019  Time: 9:30 am   Location: Craft room    Behavioral response: N/A   Intervention Topic: Self-esteem   Discussion/Intervention: Patient did not attend group.   Clinical Observations/Feedback:  Patient did not attend group.   Jeree Delcid LRT/CTRS        Erin Orr 11/01/2019 11:26 AM

## 2019-11-01 NOTE — Plan of Care (Signed)
Patient denies SI / HI / AVH. Patient is minimal with assessment. Patient dismisses any psych s/s . Patient has no insight for current admissions. Patient safety is maintained on the unit.    Problem: Education: Goal: Knowledge of [facility] Education information/materials will improve Outcome: Not Progressing Goal: Emotional status will improve Outcome: Not Progressing Goal: Mental status will improve Outcome: Not Progressing Goal: Verbalization of understanding the information provided will improve Outcome: Not Progressing

## 2019-11-01 NOTE — Progress Notes (Signed)
[facility] MD Progress Note  11/01/2019 3:53 PM Erin Orr  MRN:  [id number] Subjective: Patient seen chart reviewed.  Today I spoke with her about a [place]-acting injectable medicine.  Right off the bat I could tell that she was more nervous than before.  She was jittery.  Several times during the interview she had a bizarre shaking spell about her.  She seemed more irritable.  She insisted that she had no idea where she was or how she wound up here.  She also insisted that she had no idea what my name was.  I tried to remind her that I had introduced myself every day that I had spoken with her but she seemed fixated on this.  Very paranoid still about her family.  Would not admit to any of the previous psychotic symptoms.  Fixated on wanting to be discharged.  Refusing to consider [place]-acting injectable Principal Problem: Acute psychosis (HCC) Diagnosis: Principal Problem:   Acute psychosis (HCC) Active Problems:   Hypertension   Lumbar radiculitis   Hot flashes   Delusion (HCC)  Total Time spent with patient: 30 minutes  Past Psychiatric History: Past history reportedly of several psychiatric hospitalizations which the patient will not admit to  Past Medical History:  Past Medical History:  Diagnosis Date  . Hypertension    History reviewed. No pertinent surgical history. Family History:  Family History  Problem Relation Age of Onset  . Heart disease Brother        has 3 brothers  . Heart attack Brother   . Colon cancer Neg Hx   . Breast cancer Neg Hx    Family Psychiatric  History: Unclear Social History:  Social History   Substance and Sexual Activity  Alcohol Use Never     Social History   Substance and Sexual Activity  Drug Use Never    Social History   Socioeconomic History  . Marital status: Legally Separated    Spouse name: Not on file  . Number of children: Not on file  . Years of education: Not on file  . Highest education level: Not on file   Occupational History  . Not on file  Tobacco Use  . Smoking status: Never Smoker  . Smokeless tobacco: Never Used  Substance and Sexual Activity  . Alcohol use: Never  . Drug use: Never  . Sexual activity: Not on file  Other Topics Concern  . 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 Difficulty of Paying Living Expenses:   Food Insecurity:   . Worried About Programme researcher, broadcasting/film/video in the Last Year:   . Barista in the Last Year:   Transportation Needs:   . Freight forwarder (Medical):   Marland Kitchen Lack of Transportation (Non-Medical):   Physical Activity:   . Days of Exercise per Week:   . Minutes of Exercise per Session:   Stress:   . Feeling of Stress :   Social Connections:   . Frequency of Communication with Friends and Family:   . Frequency of Social Gatherings with Friends and Family:   . Attends Religious Services:   . Active Member of Clubs or Organizations:   . Attends Banker Meetings:   Marland Kitchen Marital Status:    Additional Social History:  Sleep: Negative  Appetite:  Negative  Current Medications: Current Facility-Administered Medications  Medication Dose Route Frequency Provider Last Rate Last Admin  . acetaminophen (TYLENOL) tablet 650 mg  650 mg Oral Q6H PRN Dixon, Rashaun M, NP      . alum & mag hydroxide-simeth (MAALOX/MYLANTA) 200-200-20 MG/5ML suspension 30 mL  30 mL Oral Q4H PRN Dixon, Rashaun M, NP      . amLODipine (NORVASC) tablet 10 mg  10 mg Oral Daily Joel Mericle, Jackquline Denmark, MD   10 mg at 11/01/19 0805  . diphenhydrAMINE (BENADRYL) capsule 25 mg  25 mg Oral Q6H PRN Jearld Lesch, NP   25 mg at 10/27/19 2147  . gabapentin (NEURONTIN) capsule 100 mg  100 mg Oral BID BM & HS PRN Mariel Craft, MD   100 mg at 10/27/19 2147  . lisinopril (ZESTRIL) tablet 5 mg  5 mg Oral Daily Venida Tsukamoto T, MD       . LORazepam (ATIVAN) tablet 1 mg  1 mg Oral Q4H PRN Dixon, Rashaun M, NP      . magnesium hydroxide (MILK OF MAGNESIA) suspension 30 mL  30 mL Oral Daily PRN Dixon, Rashaun M, NP      . metoprolol succinate (TOPROL-XL) 24 hr tablet 100 mg  100 mg Oral Daily Dixon, Rashaun M, NP   100 mg at 11/01/19 0805  . paliperidone (INVEGA) 24 hr tablet 9 mg  9 mg Oral QHS Florian Chauca, Jackquline Denmark, MD   9 mg at 10/31/19 2236  . traZODone (DESYREL) tablet 50 mg  50 mg Oral QHS PRN Jearld Lesch, NP   50 mg at 10/29/19 2120    Lab Results: No results found for this or any previous visit (from the past 48 hour(s)).  Blood Alcohol level:  Lab Results  Component Value Date   ETH <10 10/25/2019   ETH <5 09/23/2016    Metabolic Disorder Labs: Lab Results  Component Value Date   HGBA1C 5.7 09/10/2019   No results found for: PROLACTIN Lab Results  Component Value Date   CHOL 168 09/10/2019   TRIG 100.0 09/10/2019   HDL 47.70 09/10/2019   CHOLHDL 4 09/10/2019   VLDL 20.0 09/10/2019   LDLCALC 100 (H) 09/10/2019    Physical Findings: AIMS: Facial and Oral Movements Muscles of Facial Expression: None, normal Lips and Perioral Area: None, normal Jaw: None, normal Tongue: None, normal,Extremity Movements Upper (arms, wrists, hands, fingers): None, normal Lower (legs, knees, ankles, toes): None, normal, Trunk Movements Neck, shoulders, hips: None, normal, Overall Severity Severity of abnormal movements (highest score from questions above): None, normal Incapacitation due to abnormal movements: None, normal Patient's awareness of abnormal movements (rate only patient's report): No Awareness, Dental Status Current problems with teeth and/or dentures?: No Does patient usually wear dentures?: No  CIWA:    COWS:     Musculoskeletal: Strength & Muscle Tone: within normal limits Gait & Station: normal Patient leans: N/A  Psychiatric Specialty Exam: Physical Exam  Nursing note and vitals  reviewed. Constitutional: She appears well-developed and well-nourished.  HENT:  Head: Normocephalic and atraumatic.  Eyes: Pupils are equal, round, and reactive to light. Conjunctivae are normal.  Cardiovascular: Regular rhythm and normal heart sounds.  Respiratory: Effort normal. No respiratory distress.  GI: Soft.  Musculoskeletal:        General: Normal range of motion.     Cervical back: Normal range of motion.  Neurological: She is alert.  Skin: Skin is warm and dry.  Psychiatric: Her mood appears anxious. Her speech is delayed. She is slowed. Thought content is paranoid and delusional. Cognition and memory are impaired. She expresses inappropriate judgment. She does not express impulsivity.    Review of Systems  Constitutional: Negative.   HENT: Negative.   Eyes: Negative.   Respiratory: Negative.   Cardiovascular: Negative.   Gastrointestinal: Negative.   Musculoskeletal: Negative.   Skin: Negative.   Neurological: Negative.   Psychiatric/Behavioral: Positive for confusion. The patient is nervous/anxious.     Blood pressure (!) 159/100, pulse (!) 109, temperature 98.5 F (36.9 C), temperature source Oral, resp. rate 18, height 5\' 6"  (1.676 m), weight 86.2 kg, SpO2 99 %.Body mass index is 30.67 kg/m.  General Appearance: Casual  Eye Contact:  Minimal  Speech:  Slow  Volume:  Decreased  Mood:  Anxious and Irritable  Affect:  Inappropriate  Thought Process:  Disorganized  Orientation:  Negative  Thought Content:  Paranoid Ideation and Rumination  Suicidal Thoughts:  No  Homicidal Thoughts:  No  Memory:  Immediate;   Fair Recent;   Poor Remote;   Poor  Judgement:  Impaired  Insight:  Shallow  Psychomotor Activity:  Decreased  Concentration:  Concentration: Poor  Recall:  Poor  Fund of Knowledge:  Fair  Language:  Fair  Akathisia:  No  Handed:  Right  AIMS (if indicated):     Assets:  Social Support  ADL's:  Impaired  Cognition:  Impaired,  Mild  Sleep:   Number of Hours: 8.15     Treatment Plan Summary: Daily contact with patient to assess and evaluate symptoms and progress in treatment, Medication management and Plan I tried to form some rapport while still billing clear that I thought that she had an illness that needed treatment.  I pointed out how she was driving her family away.  This just got her more anxious.  She refused to even consider taking the [place]-acting injectable.  When I asked her about the oral medicine she started to insist that she had no idea what that was even though we had discussed it at the beginning.  Patient does not really necessarily meet criteria for forcing a Blaze-acting injectable but certainly is not getting much better.  No plan for discharge now.  If no improvement over the weekend may consider discharge at that point  Alethia Berthold, MD 11/01/2019, 3:53 PM

## 2019-11-02 NOTE — [facility] Group Notes (Signed)
[facility] LCSW Group Therapy Note  Date/Time:  11/02/2019  1:00PM  Type of Therapy and Topic:  Group Therapy:  Healthy and Unhealthy Supports  Participation Level:  Did Not Attend   Description of Group:  Patients in this group were introduced to the idea of adding a variety of healthy supports to address the various needs in their lives.Patients discussed what additional healthy supports could be helpful in their recovery and wellness after discharge in order to prevent future hospitalizations.   An emphasis was placed on using counselor, doctor, therapy groups, 12-step groups, and problem-specific support groups to expand supports.  They also worked as a group on developing a specific plan for several patients to deal with unhealthy supports through boundary-setting, psychoeducation with loved ones, and even termination of relationships.   Therapeutic Goals:   1)  discuss importance of adding supports to stay well once out of the hospital  2)  compare healthy versus unhealthy supports and identify some examples of each  3)  generate ideas and descriptions of healthy supports that can be added  4)  offer mutual support about how to address unhealthy supports  5)  encourage active participation in and adherence to discharge plan    Summary of Patient Progress:   Patient did not attend group.   Therapeutic Modalities:   Motivational Interviewing Brief Solution-Focused Therapy   Marlinda Miranda, MSW, LCSW   

## 2019-11-02 NOTE — Progress Notes (Signed)
[facility] MD Progress Note  11/02/2019 11:43 AM Erin Orr  MRN:  [id number]   Erin Orr is a 59yo F who was admitted to [facility] unit for acute psychosis.  Patient seen.  Chart reviewed. Patient discussed with nursing; no overnight events reported.  Subjective: Patient appears emotionless, her speech is monotone. She thinks she is in the hospital for blood pressure control. She wants to be discharged as she believes her blood presser is better now. She expresses some paranoid thoughts "I think tere are not enough people at this hospital today. There should be more". She asked me to write on a board in her room all people who work in the unit today and became upset and suspicious when I put first names and revealed that I don`t know last names of all workers. She reports "okay" mood and denies any thoughts of harming self or others. She is focused on food also. She says she was on special diet and that here in the hospital we have to make sure to give her the food she likes, but she could not explain what kind of food she means.   Principal Problem: Acute psychosis ([place]) Diagnosis: Principal Problem:   Acute psychosis ([place]) Active Problems:   Hypertension   Lumbar radiculitis   Hot flashes   Delusion ([place])  Total Time spent with patient: 15 minutes  Past Psychiatric History: see H&P  Past Medical History:  Past Medical History:  Diagnosis Date  . Hypertension    History reviewed. No pertinent surgical history. Family History:  Family History  Problem Relation Age of Onset  . Heart disease Brother        has 3 brothers  . Heart attack Brother   . Colon cancer Neg Hx   . Breast cancer Neg Hx    Family Psychiatric  History: see H&P Social History:  Social History   Substance and Sexual Activity  Alcohol Use Never     Social History   Substance and Sexual Activity  Drug Use Never    Social History   Socioeconomic History  . Marital status: Legally Separated    Spouse  name: Not on file  . Number of children: Not on file  . Years of education: Not on file  . Highest education level: Not on file  Occupational History  . Not on file  Tobacco Use  . Smoking status: Never Smoker  . Smokeless tobacco: Never Used  Substance and Sexual Activity  . Alcohol use: Never  . Drug use: Never  . Sexual activity: Not on file  Other Topics Concern  . 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 Difficulty of Paying Living Expenses:   Food Insecurity:   . Worried About Charity fundraiser in the Last Year:   . Arboriculturist in the Last Year:   Transportation Needs:   . Film/video editor (Medical):   Marland Kitchen Lack of Transportation (Non-Medical):   Physical Activity:   . Days of Exercise per Week:   . Minutes of Exercise per Session:   Stress:   . Feeling of Stress :   Social Connections:   . Frequency of Communication with Friends and Family:   . Frequency of Social Gatherings with Friends and Family:   . Attends Religious Services:   . Active Member  of Clubs or Organizations:   . Attends Banker Meetings:   Marland Kitchen Marital Status:    Additional Social History:                         Sleep: Fair  Appetite:  Fair  Current Medications: Current Facility-Administered Medications  Medication Dose Route Frequency Provider Last Rate Last Admin  . acetaminophen (TYLENOL) tablet 650 mg  650 mg Oral Q6H PRN Dixon, Rashaun M, NP      . alum & mag hydroxide-simeth (MAALOX/MYLANTA) 200-200-20 MG/5ML suspension 30 mL  30 mL Oral Q4H PRN Dixon, Rashaun M, NP      . amLODipine (NORVASC) tablet 10 mg  10 mg Oral Daily Clapacs, Jackquline Denmark, MD   10 mg at 11/02/19 0754  . diphenhydrAMINE (BENADRYL) capsule 25 mg  25 mg Oral Q6H PRN Jearld Lesch, NP   25 mg at 10/27/19 2147  . gabapentin (NEURONTIN) capsule 100 mg  100 mg Oral BID BM & HS PRN  Mariel Craft, MD   100 mg at 10/27/19 2147  . lisinopril (ZESTRIL) tablet 5 mg  5 mg Oral Daily Clapacs, Jackquline Denmark, MD   5 mg at 11/02/19 0754  . LORazepam (ATIVAN) tablet 1 mg  1 mg Oral Q4H PRN Dixon, Rashaun M, NP      . magnesium hydroxide (MILK OF MAGNESIA) suspension 30 mL  30 mL Oral Daily PRN Dixon, Rashaun M, NP      . metoprolol succinate (TOPROL-XL) 24 hr tablet 100 mg  100 mg Oral Daily Dixon, Rashaun M, NP   100 mg at 11/02/19 0752  . paliperidone (INVEGA) 24 hr tablet 9 mg  9 mg Oral QHS Clapacs, Jackquline Denmark, MD   9 mg at 11/01/19 2131  . traZODone (DESYREL) tablet 50 mg  50 mg Oral QHS PRN Jearld Lesch, NP   50 mg at 10/29/19 2120    Lab Results: No results found for this or any previous visit (from the past 48 hour(s)).  Blood Alcohol level:  Lab Results  Component Value Date   ETH <10 10/25/2019   ETH <5 09/23/2016    Metabolic Disorder Labs: Lab Results  Component Value Date   HGBA1C 5.7 09/10/2019   No results found for: PROLACTIN Lab Results  Component Value Date   CHOL 168 09/10/2019   TRIG 100.0 09/10/2019   HDL 47.70 09/10/2019   CHOLHDL 4 09/10/2019   VLDL 20.0 09/10/2019   LDLCALC 100 (H) 09/10/2019    Physical Findings: AIMS: Facial and Oral Movements Muscles of Facial Expression: None, normal Lips and Perioral Area: None, normal Jaw: None, normal Tongue: None, normal,Extremity Movements Upper (arms, wrists, hands, fingers): None, normal Lower (legs, knees, ankles, toes): None, normal, Trunk Movements Neck, shoulders, hips: None, normal, Overall Severity Severity of abnormal movements (highest score from questions above): None, normal Incapacitation due to abnormal movements: None, normal Patient's awareness of abnormal movements (rate only patient's report): No Awareness, Dental Status Current problems with teeth and/or dentures?: No Does patient usually wear dentures?: No  CIWA:    COWS:     Musculoskeletal: Strength & Muscle Tone:  within normal limits Gait & Station: normal Patient leans: N/A  Psychiatric Specialty Exam: Physical Exam  Review of Systems  Blood pressure (!) 118/91, pulse (!) 101, temperature 98.5 F (36.9 C), temperature source Oral, resp. rate 18, height 5\' 6"  (1.676 m), weight 86.2 kg, SpO2 99 %.Body mass index is  30.67 kg/m.  General Appearance: Casual  Eye Contact:  Minimal  Speech:  Slow  Volume:  Decreased  Mood:  Anxious  Affect:  Constricted  Thought Process:  Disorganized  Orientation:  Negative  Thought Content:  Illogical, Paranoid Ideation and Rumination  Suicidal Thoughts:  No  Homicidal Thoughts:  No  Memory:  Immediate;   Fair  Judgement:  Impaired  Insight:  Lacking  Psychomotor Activity:  Decreased  Concentration:  Concentration: Fair  Recall:  Poor  Fund of Knowledge:  Fair  Language:  Fair  Akathisia:  No  Handed:  Right  AIMS (if indicated):     Assets:  Social Support  ADL's:  Impaired  Cognition:  Impaired,  Mild  Sleep:  Number of Hours: 8.15     Treatment Plan Summary: Daily contact with patient to assess and evaluate symptoms and progress in treatment and Medication management  Patient continues to be paranoid and delusional, without insight in her condition and with impaired judgement. However, she is compliant with medications, including Paliperidone and still not open to LAI medication. Will continue all medications as scheduled today and hope to see signs of improvement on current antipsychotic soon: Paliperidone 9mg  PO QHS for psychosis, Trazodone PRN for sleep, Ativan 1mg  PO Q4h PRN anxiety and Benadryl 25mg  PO Q6H PRN agitation. Disposition: to be determined. Patient is not ready for discharge at this time.    , MD 11/02/2019, 11:43 AM

## 2019-11-02 NOTE — Plan of Care (Signed)
Patient denies SI / HI / AVH. Patient is very pleasant and participates with assessment. Patient has no insight on current admission, patient denies any s/s. Patient is focused on blood pressure control this morning and is worried about recent high blood pressures. Patient is adherent with scheduled medications. Patient is seen in milieu interacting with peers. Patient is without complaint of pain. Patient reports wanting to discharge soon. Patient's safety is maintained on the unit.    Problem: Education: Goal: Knowledge of [facility] Education information/materials will improve Outcome: Not Progressing Goal: Emotional status will improve Outcome: Not Progressing Goal: Mental status will improve Outcome: Not Progressing Goal: Verbalization of understanding the information provided will improve Outcome: Not Progressing

## 2019-11-03 NOTE — Progress Notes (Signed)
[facility] MD Progress Note  11/03/2019 11:28 AM Erin Orr  MRN:  [id number]   Ms. Erin Orr is a 59yo F who was admitted to [facility] unit for acute psychosis.  Patient seen.  Chart reviewed. Patient discussed with nursing; no overnight events reported. Patient reluctantly took her medications at night. Said she was supposed to be discharged. Remained in bed and refused vital signs this morning.  Subjective: Patient spends her day in bed. She demands to be discharged. She reports "I dont want to deal with [place] anymore. I want to see a doctor from [facility], not you". She believes she is not in [facility] unit, but in a medical unit. She thinks she was admitted for blood pressure management and does not understand why she is still here. She states she does not have any mental issues and is not on medications for mental issues. She says she did not eat breakfast today; when asked what was the reason for that, she looks suspicious and asks "why do you think the food is poisoned?". When I answered that I did not say the food is poisoned, patient said "but you think so". She also believes "all computers are down" and that she cannot get her medications because of that. She asked me to leave "I dont want to have anything with [place] and I don`t want to speak with you anymore."   Principal Problem: Acute psychosis ([place]) Diagnosis: Principal Problem:   Acute psychosis ([place]) Active Problems:   Hypertension   Lumbar radiculitis   Hot flashes   Delusion ([place])  Total Time spent with patient: 15 minutes  Past Psychiatric History: see H&P  Past Medical History:  Past Medical History:  Diagnosis Date  . Hypertension    History reviewed. No pertinent surgical history. Family History:  Family History  Problem Relation Age of Onset  . Heart disease Brother        has 3 brothers  . Heart attack Brother   . Colon cancer Neg Hx   . Breast cancer Neg Hx    Family Psychiatric  History: see H&P Social  History:  Social History   Substance and Sexual Activity  Alcohol Use Never     Social History   Substance and Sexual Activity  Drug Use Never    Social History   Socioeconomic History  . Marital status: Legally Separated    Spouse name: Not on file  . Number of children: Not on file  . Years of education: Not on file  . Highest education level: Not on file  Occupational History  . Not on file  Tobacco Use  . Smoking status: Never Smoker  . Smokeless tobacco: Never Used  Substance and Sexual Activity  . Alcohol use: Never  . Drug use: Never  . Sexual activity: Not on file  Other Topics Concern  . 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 Difficulty of Paying Living Expenses:   Food Insecurity:   . Worried About Charity fundraiser in the Last Year:   . Arboriculturist in the Last Year:   Transportation Needs:   . Film/video editor (Medical):   Marland Kitchen Lack of Transportation (Non-Medical):   Physical Activity:   . Days of Exercise per Week:   . Minutes of Exercise per Session:   Stress:   .  Feeling of Stress :   Social Connections:   . Frequency of Communication with Friends and Family:   . Frequency of Social Gatherings with Friends and Family:   . Attends Religious Services:   . Active Member of Clubs or Organizations:   . Attends Banker Meetings:   Marland Kitchen Marital Status:    Additional Social History:                         Sleep: Fair  Appetite:  Fair  Current Medications: Current Facility-Administered Medications  Medication Dose Route Frequency Provider Last Rate Last Admin  . acetaminophen (TYLENOL) tablet 650 mg  650 mg Oral Q6H PRN Dixon, Rashaun M, NP      . alum & mag hydroxide-simeth (MAALOX/MYLANTA) 200-200-20 MG/5ML suspension 30 mL  30 mL Oral Q4H PRN Dixon, Rashaun M, NP      . amLODipine (NORVASC) tablet 10  mg  10 mg Oral Daily Clapacs, Jackquline Denmark, MD   10 mg at 11/02/19 0754  . diphenhydrAMINE (BENADRYL) capsule 25 mg  25 mg Oral Q6H PRN Jearld Lesch, NP   25 mg at 10/27/19 2147  . gabapentin (NEURONTIN) capsule 100 mg  100 mg Oral BID BM & HS PRN Mariel Craft, MD   100 mg at 10/27/19 2147  . lisinopril (ZESTRIL) tablet 5 mg  5 mg Oral Daily Clapacs, Jackquline Denmark, MD   5 mg at 11/02/19 0754  . LORazepam (ATIVAN) tablet 1 mg  1 mg Oral Q4H PRN Dixon, Rashaun M, NP      . magnesium hydroxide (MILK OF MAGNESIA) suspension 30 mL  30 mL Oral Daily PRN Dixon, Rashaun M, NP      . metoprolol succinate (TOPROL-XL) 24 hr tablet 100 mg  100 mg Oral Daily Dixon, Rashaun M, NP   100 mg at 11/02/19 0752  . paliperidone (INVEGA) 24 hr tablet 9 mg  9 mg Oral QHS Clapacs, Jackquline Denmark, MD   9 mg at 11/02/19 2151  . traZODone (DESYREL) tablet 50 mg  50 mg Oral QHS PRN Jearld Lesch, NP   50 mg at 11/02/19 2151    Lab Results: No results found for this or any previous visit (from the past 48 hour(s)).  Blood Alcohol level:  Lab Results  Component Value Date   ETH <10 10/25/2019   ETH <5 09/23/2016    Metabolic Disorder Labs: Lab Results  Component Value Date   HGBA1C 5.7 09/10/2019   No results found for: PROLACTIN Lab Results  Component Value Date   CHOL 168 09/10/2019   TRIG 100.0 09/10/2019   HDL 47.70 09/10/2019   CHOLHDL 4 09/10/2019   VLDL 20.0 09/10/2019   LDLCALC 100 (H) 09/10/2019    Physical Findings: AIMS: Facial and Oral Movements Muscles of Facial Expression: None, normal Lips and Perioral Area: None, normal Jaw: None, normal Tongue: None, normal,Extremity Movements Upper (arms, wrists, hands, fingers): None, normal Lower (legs, knees, ankles, toes): None, normal, Trunk Movements Neck, shoulders, hips: None, normal, Overall Severity Severity of abnormal movements (highest score from questions above): None, normal Incapacitation due to abnormal movements: None, normal Patient's  awareness of abnormal movements (rate only patient's report): No Awareness, Dental Status Current problems with teeth and/or dentures?: No Does patient usually wear dentures?: No  CIWA:    COWS:     Musculoskeletal: Strength & Muscle Tone: within normal limits Gait & Station: normal Patient leans: N/A  Psychiatric Specialty  Exam: Physical Exam   Review of Systems   Blood pressure (!) 118/91, pulse (!) 101, temperature 98.5 F (36.9 C), temperature source Oral, resp. rate 18, height 5\' 6"  (1.676 m), weight 86.2 kg, SpO2 99 %.Body mass index is 30.67 kg/m.  General Appearance: Casual  Eye Contact:  Minimal  Speech:  Slow  Volume:  Decreased  Mood:  Anxious  Affect:  Constricted  Thought Process:  Disorganized  Orientation:  Negative  Thought Content:  Illogical, Paranoid Ideation and Rumination  Suicidal Thoughts:  No  Homicidal Thoughts:  No  Memory:  Immediate;   Fair  Judgement:  Impaired  Insight:  Lacking  Psychomotor Activity:  Decreased  Concentration:  Concentration: Fair  Recall:  Poor  Fund of Knowledge:  Fair  Language:  Fair  Akathisia:  No  Handed:  Right  AIMS (if indicated):     Assets:  Social Support  ADL's:  Impaired  Cognition:  Impaired,  Mild  Sleep:  Number of Hours: 8.15     Treatment Plan Summary: Daily contact with patient to assess and evaluate symptoms and progress in treatment and Medication management  Patient continues to be paranoid and delusional, without insight in her condition and with impaired judgement. However, she is compliant with medications, including Paliperidone and still not open to LAI medication. Will continue all medications as scheduled today and hope to see signs of improvement on current antipsychotic soon: Paliperidone 9mg  PO QHS for psychosis, Trazodone PRN for sleep, Ativan 1mg  PO Q4h PRN anxiety and Benadryl 25mg  PO Q6H PRN agitation. Disposition: to be determined. Patient is not ready for discharge at this  time.    , MD 11/03/2019, 11:28 AM

## 2019-11-03 NOTE — Tx Team (Signed)
Interdisciplinary Treatment and Diagnostic Plan Update  11/03/2019 Time of Session: 830am Erin Orr MRN: [id number]  Principal Diagnosis: Acute psychosis [facility])  Secondary Diagnoses: Principal Problem:   Acute psychosis (HCC) Active Problems:   Hypertension   Lumbar radiculitis   Hot flashes   Delusion (HCC)   Current Medications:  Current Facility-Administered Medications  Medication Dose Route Frequency Provider Last Rate Last Admin  . acetaminophen (TYLENOL) tablet 650 mg  650 mg Oral Q6H PRN Dixon, Rashaun M, NP      . alum & mag hydroxide-simeth (MAALOX/MYLANTA) 200-200-20 MG/5ML suspension 30 mL  30 mL Oral Q4H PRN Dixon, Rashaun M, NP      . amLODipine (NORVASC) tablet 10 mg  10 mg Oral Daily Clapacs, Jackquline Denmark, MD   10 mg at 11/02/19 0754  . diphenhydrAMINE (BENADRYL) capsule 25 mg  25 mg Oral Q6H PRN Jearld Lesch, NP   25 mg at 10/27/19 2147  . gabapentin (NEURONTIN) capsule 100 mg  100 mg Oral BID BM & HS PRN Mariel Craft, MD   100 mg at 10/27/19 2147  . lisinopril (ZESTRIL) tablet 5 mg  5 mg Oral Daily Clapacs, Jackquline Denmark, MD   5 mg at 11/02/19 0754  . LORazepam (ATIVAN) tablet 1 mg  1 mg Oral Q4H PRN Dixon, Rashaun M, NP      . magnesium hydroxide (MILK OF MAGNESIA) suspension 30 mL  30 mL Oral Daily PRN Dixon, Rashaun M, NP      . metoprolol succinate (TOPROL-XL) 24 hr tablet 100 mg  100 mg Oral Daily Dixon, Rashaun M, NP   100 mg at 11/02/19 0752  . paliperidone (INVEGA) 24 hr tablet 9 mg  9 mg Oral QHS Clapacs, Jackquline Denmark, MD   9 mg at 11/02/19 2151  . traZODone (DESYREL) tablet 50 mg  50 mg Oral QHS PRN Jearld Lesch, NP   50 mg at 11/02/19 2151   PTA Medications: Medications Prior to Admission  Medication Sig Dispense Refill Last Dose  . amLODipine (NORVASC) 5 MG tablet Take 1 tablet (5 mg total) by mouth daily. 90 tablet 3   . gabapentin (NEURONTIN) 100 MG capsule Take one tablet ( 100 mg) by mouth one hour before bedtime every night. 30 capsule 1    . metoprolol succinate (TOPROL-XL) 100 MG 24 hr tablet Take 1 tablet (100 mg total) by mouth daily. 60 tablet 3     Patient Stressors: Marital or family conflict Other: Paranoia, mistrust of others  Patient Strengths: Active sense of humor Physical Health Supportive family/friends  Treatment Modalities: Medication Management, Group therapy, Case management,  1 to 1 session with clinician, Psychoeducation, Recreational therapy.   Physician Treatment Plan for Primary Diagnosis: Acute psychosis (HCC) [place] Term Goal(s): Improvement in symptoms so as ready for discharge Improvement in symptoms so as ready for discharge   Short Term Goals: Ability to identify changes in lifestyle to reduce recurrence of condition will improve Ability to identify and develop effective coping behaviors will improve Ability to maintain clinical measurements within normal limits will improve Compliance with prescribed medications will improve Ability to identify changes in lifestyle to reduce recurrence of condition will improve Ability to verbalize feelings will improve Ability to identify and develop effective coping behaviors will improve Ability to maintain clinical measurements within normal limits will improve Compliance with prescribed medications will improve  Medication Management: Evaluate patient's response, side effects, and tolerance of medication regimen.  Therapeutic Interventions: 1 to 1 sessions, Unit  Group sessions and Medication administration.  Evaluation of Outcomes: Progressing  Physician Treatment Plan for Secondary Diagnosis: Principal Problem:   Acute psychosis (HCC) Active Problems:   Hypertension   Lumbar radiculitis   Hot flashes   Delusion (HCC)  [place] Term Goal(s): Improvement in symptoms so as ready for discharge Improvement in symptoms so as ready for discharge   Short Term Goals: Ability to identify changes in lifestyle to reduce recurrence of condition will  improve Ability to identify and develop effective coping behaviors will improve Ability to maintain clinical measurements within normal limits will improve Compliance with prescribed medications will improve Ability to identify changes in lifestyle to reduce recurrence of condition will improve Ability to verbalize feelings will improve Ability to identify and develop effective coping behaviors will improve Ability to maintain clinical measurements within normal limits will improve Compliance with prescribed medications will improve     Medication Management: Evaluate patient's response, side effects, and tolerance of medication regimen.  Therapeutic Interventions: 1 to 1 sessions, Unit Group sessions and Medication administration.  Evaluation of Outcomes: Progressing   RN Treatment Plan for Primary Diagnosis: Acute psychosis (HCC) Nan Term Goal(s): Knowledge of disease and therapeutic regimen to maintain health will improve  Short Term Goals: Ability to participate in decision making will improve, Ability to verbalize feelings will improve, Ability to identify and develop effective coping behaviors will improve and Compliance with prescribed medications will improve  Medication Management: RN will administer medications as ordered by provider, will assess and evaluate patient's response and provide education to patient for prescribed medication. RN will report any adverse and/or side effects to prescribing provider.  Therapeutic Interventions: 1 on 1 counseling sessions, Psychoeducation, Medication administration, Evaluate responses to treatment, Monitor vital signs and CBGs as ordered, Perform/monitor CIWA, COWS, AIMS and Fall Risk screenings as ordered, Perform wound care treatments as ordered.  Evaluation of Outcomes: Progressing   LCSW Treatment Plan for Primary Diagnosis: Acute psychosis (HCC) [place] Term Goal(s): Safe transition to appropriate next level of care at discharge,  Engage patient in therapeutic group addressing interpersonal concerns.  Short Term Goals: Engage patient in aftercare planning with referrals and resources  Therapeutic Interventions: Assess for all discharge needs, 1 to 1 time with Social worker, Explore available resources and support systems, Assess for adequacy in community support network, Educate family and significant other(s) on suicide prevention, Complete Psychosocial Assessment, Interpersonal group therapy.  Evaluation of Outcomes: Progressing   Progress in Treatment: Attending groups: No. Participating in groups: No. Taking medication as prescribed: Yes. Toleration medication: Yes. Family/Significant other contact made: Yes, individual(s) contacted:  Philomena Doheny, son Patient understands diagnosis: Yes. Discussing patient identified problems/goals with staff: Yes. Medical problems stabilized or resolved: Yes. Denies suicidal/homicidal ideation: Yes. Issues/concerns per patient self-inventory: No. Other: NA  New problem(s) identified: No, Describe:  None reported  New Short Term/[place] Term Goal(s):Attend outpatient treatment, take medication as prescribed, develop and implement healthy coping methods  Patient Goals:  "make sure Im okay when I get out"  Discharge Plan or Barriers: Pt will return home and follow up with outpatient treatment. Update 11/03/19- Pt will return home, declines referral for outpatient treatment. Pt states she will follow up with PCP for medication management.  Reason for Continuation of Hospitalization: Medication stabilization  Estimated Length of Stay:TBD Recreational Therapy: Patient Stressors: Family, Friends Patient Goal: Patient will identify 3 positive coping skills strategies to use post d/c within 5 recreation therapy group sessions  Attendees: Patient: 11/03/2019 8:36 AM  Physician: Jonny Ruiz  Clapacs 11/03/2019 8:36 AM  Nursing:  11/03/2019 8:36 AM  RN Care Manager: 11/03/2019 8:36 AM   Social Worker: Sanjuana Kava  11/03/2019 8:36 AM  Recreational Therapist:  11/03/2019 8:36 AM  Other:  11/03/2019 8:36 AM  Other:  11/03/2019 8:36 AM  Other: 11/03/2019 8:36 AM    Scribe for Treatment Team: Yvette Rack, LCSW 11/03/2019 8:36 AM

## 2019-11-03 NOTE — Progress Notes (Signed)
Patient is alert and oriented x 3, affect is blunted thoughts are disorganized and incoherent, she appears responding to internal stimuli, acting bizarre not interacting appropriately with peers and staff. Patient was noted talking to herself not receptive to staff she was pacing, refused medication regimen and evening snack. 15 minutes safety checks maintained will continue to monitor

## 2019-11-03 NOTE — Progress Notes (Signed)
Patient reluctantly took her medications at hs. Said she was supposed to be discharged. Remained in bed and refused vital signs this morning. Denies SI HI and AVH

## 2019-11-03 NOTE — Plan of Care (Signed)
°  Problem: Education: °Goal: Knowledge of [place] [facility] Education information/materials will improve °Outcome: Not Progressing °Goal: Emotional status will improve °Outcome: Not Progressing °Goal: Mental status will improve °Outcome: Not Progressing °Goal: Verbalization of understanding the information provided will improve °Outcome: Not Progressing °  °

## 2019-11-03 NOTE — Progress Notes (Signed)
D:Patient observed in her room sitting on side of bed. Patient is irritable and she is refusing her morning medications after 3 attempts. Patient states "I am discharged" Patient will not engage in further conversation with this Clinical research associate.  A:Patient educated on taking her medications is importance in order to be discharged. Patient was not interested in conversation only to say again "I am discharged." R: Q 15 checks in progress and patient remains safe on unit. Monitoring continues.

## 2019-11-04 ENCOUNTER — Telehealth: Payer: Self-pay | Admitting: Family

## 2019-11-04 DIAGNOSIS — F23 Brief psychotic disorder: Secondary | ICD-10-CM

## 2019-11-04 MED ORDER — LISINOPRIL 10 MG PO TABS
10.0000 mg | ORAL_TABLET | Freq: Every day | ORAL | Status: DC
  Filled 2019-11-04: qty 1

## 2019-11-04 MED ORDER — LISINOPRIL 10 MG PO TABS: 10.0000 mg | ORAL_TABLET | Freq: Every day | ORAL | 1 refills | Status: DC

## 2019-11-04 MED ORDER — AMLODIPINE BESYLATE 10 MG PO TABS: 10.0000 mg | ORAL_TABLET | Freq: Every day | ORAL | 1 refills | Status: DC

## 2019-11-04 MED ORDER — PALIPERIDONE ER 9 MG PO TB24: 9.0000 mg | ORAL_TABLET | Freq: Every day | ORAL | 1 refills | Status: DC

## 2019-11-04 MED ORDER — GABAPENTIN 100 MG PO CAPS: 100.0000 mg | ORAL_CAPSULE | Freq: Two times a day (BID) | ORAL | 1 refills | Status: DC | PRN

## 2019-11-04 MED ORDER — METOPROLOL SUCCINATE ER 100 MG PO TB24: 100.0000 mg | ORAL_TABLET | Freq: Every day | ORAL | 1 refills | Status: DC

## 2019-11-04 NOTE — Progress Notes (Signed)
Pt went to one group this morning. Pt is pleasant and anticipating discharge. Pt awaiting to be picked him by her son by 1700. Torrie Mayers Rn

## 2019-11-04 NOTE — Telephone Encounter (Signed)
That's fine however my concern is she needs a psychiatrist appt more than a pcp appt. I dont manage the medications that she has been placed on   Maralyn Sago- is son on DPR?  Who can we can call about ref to psychiatry? I placed urgent referral  However Im not sure if I put patient's phone number, son, husband.. not sure who is on [facility]  Rasheedah, how quickly can we get her in?

## 2019-11-04 NOTE — BHH Group Notes (Signed)
LCSW Group Therapy Note   11/04/2019 2:37 PM  Type of Therapy and Topic:  Group Therapy:  Overcoming Obstacles   Participation Level:  Did Not Attend   Description of Group:    In this group patients will be encouraged to explore what they see as obstacles to their own wellness and recovery. They will be guided to discuss their thoughts, feelings, and behaviors related to these obstacles. The group will process together ways to cope with barriers, with attention given to specific choices patients can make. Each patient will be challenged to identify changes they are motivated to make in order to overcome their obstacles. This group will be process-oriented, with patients participating in exploration of their own experiences as well as giving and receiving support and challenge from other group members.   Therapeutic Goals: 1. Patient will identify personal and current obstacles as they relate to admission. 2. Patient will identify barriers that currently interfere with their wellness or overcoming obstacles.  3. Patient will identify feelings, thought process and behaviors related to these barriers. 4. Patient will identify two changes they are willing to make to overcome these obstacles:      Summary of Patient Progress X   Therapeutic Modalities:   Cognitive Behavioral Therapy Solution Focused Therapy Motivational Interviewing Relapse Prevention Therapy  Penni Homans, MSW, LCSW 11/04/2019 2:37 PM

## 2019-11-04 NOTE — Progress Notes (Signed)
Pt refused to have her VS rechecked after she received her meds. MD notified.  Torrie Mayers RN

## 2019-11-04 NOTE — Telephone Encounter (Signed)
LM again to call back  

## 2019-11-04 NOTE — Telephone Encounter (Signed)
Pt needs a HFU this week according to the hospital. Pt is in ED for psychosis. There are no appts avail except SAME DAY. Please call pt late this afternoon

## 2019-11-04 NOTE — BHH Counselor (Addendum)
CSW met with pt to discuss discharge plan. Pt continues to decline referral for outpatient treatment and states she will follow up with PCP, Dr. Mable Paris for medication management. CSW spoke with Ailene Ravel, Marketing executive at [employer] at [place]) [phone] who says she was unable to speak with the physicians administrative assistant to schedule an appointment. Ailene Ravel states she will have the assistant contact the patient late afternoon to schedule hospital follow up  appointment with Dr. Vidal Schwalbe.

## 2019-11-04 NOTE — Progress Notes (Signed)
  [facility] Adult Case Management Discharge Plan :  Will you be returning to the same living situation after discharge:  Yes,  lives with spouse At discharge, do you have transportation home?: Yes,  pt reports son will pick up Do you have the ability to pay for your medications: Yes,  Medicare  Release of information consent forms completed and in the chart;  Patient's signature needed at discharge.  Patient to Follow up at: Follow-up Information    Nature conservation officer at [facility] Follow up.   Why: Scheduler will call you late afternoon to schedule your appointment with Dr. Jason Coop. Thank you. Contact information: [address]  [place], [place] [address] Ph:([phone] [phone]          Next level of care provider has access to [facility] Link:no  Safety Planning and Suicide Prevention discussed: Yes,  Philomena Doheny, son  Have you used any form of tobacco in the last 30 days? (Cigarettes, Smokeless Tobacco, Cigars, and/or Pipes): No  Has patient been referred to the Quitline?: N/A patient is not a smoker  Patient has been referred for addiction treatment: N/A  Suzan Slick, LCSW 11/04/2019, 1:38 PM

## 2019-11-04 NOTE — Telephone Encounter (Signed)
Are you okay with my putting patient in same day?

## 2019-11-04 NOTE — Discharge Summary (Signed)
Physician Discharge Summary Note  Patient:  Erin Orr is an 59 y.o., female MRN:  [id number] DOB:  Jan 20, 1961 Patient phone:  [phone] (home)  Patient address:   [address] [place] [address],  Total Time spent with patient: 30 minutes  Date of Admission:  10/26/2019 Date of Discharge: Nov 04, 2019  Reason for Admission: Patient was admitted because of reports of paranoid thinking and disorganized behavior  Principal Problem: Acute psychosis [facility]) Discharge Diagnoses: Principal Problem:   Acute psychosis (HCC) Active Problems:   Hypertension   Lumbar radiculitis   Hot flashes   Delusion (HCC)   Past Psychiatric History: Patient has a past history by report of psychotic behavior with hospitalization although she denies it.  Past Medical History:  Past Medical History:  Diagnosis Date  . Hypertension    History reviewed. No pertinent surgical history. Family History:  Family History  Problem Relation Age of Onset  . Heart disease Brother        has 3 brothers  . Heart attack Brother   . Colon cancer Neg Hx   . Breast cancer Neg Hx    Family Psychiatric  History: Evidently has a brother who has mental health problems as well Social History:  Social History   Substance and Sexual Activity  Alcohol Use Never     Social History   Substance and Sexual Activity  Drug Use Never    Social History   Socioeconomic History  . Marital status: Legally Separated    Spouse name: Not on file  . Number of children: Not on file  . Years of education: Not on file  . Highest education level: Not on file  Occupational History  . Not on file  Tobacco Use  . Smoking status: Never Smoker  . Smokeless tobacco: Never Used  Substance and Sexual Activity  . Alcohol use: Never  . Drug use: Never  . Sexual activity: Not on file  Other Topics Concern  . 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 Difficulty of Paying Living Expenses:   Food Insecurity:   . Worried About Programme researcher, broadcasting/film/video in the Last Year:   . Barista in the Last Year:   Transportation Needs:   . Freight forwarder (Medical):   Marland Kitchen Lack of Transportation (Non-Medical):   Physical Activity:   . Days of Exercise per Week:   . Minutes of Exercise per Session:   Stress:   . Feeling of Stress :   Social Connections:   . Frequency of Communication with Friends and Family:   . Frequency of Social Gatherings with Friends and Family:   . Attends Religious Services:   . Active Member of Clubs or Organizations:   . Attends Banker Meetings:   Marland Kitchen Marital Status:     Hospital Course: Patient admitted to the psychiatric unit.  15-minute checks maintained.  She at no time showed any dangerous aggressive or violent behavior.  Completely denied suicidal ideation.  Patient was cooperative with treatment although her insight was extremely limited.  She denied any past psychiatric hospitalization.  She was agreeable to the treatment team speaking with her husband and son, both of whom reported clear instances of paranoia and psychotic thinking recently as well as endorsed prior psychiatric hospitalizations.  Patient denied all  of this.  She did accept oral antipsychotic medicine although she consistently refused to take Eustice-acting injectable medicine.  At the time of discharge she continues to show little insight.  She denies any of the delusional believes attributed to her by her family.  She has been limited in the amount that she eats in the hospital but does seem to have made an effort to do better with that and does not appear to be starving.  Her blood pressure has been treated as it has continued to be consistently high.  At this point she is denying suicidal and homicidal thoughts and is behaving calmly and agreeable to outpatient mental health  treatment.  She will be discharged with prescriptions for her medicine and psychoeducation done about the importance of treatment.  Physical Findings: AIMS: Facial and Oral Movements Muscles of Facial Expression: None, normal Lips and Perioral Area: None, normal Jaw: None, normal Tongue: None, normal,Extremity Movements Upper (arms, wrists, hands, fingers): None, normal Lower (legs, knees, ankles, toes): None, normal, Trunk Movements Neck, shoulders, hips: None, normal, Overall Severity Severity of abnormal movements (highest score from questions above): None, normal Incapacitation due to abnormal movements: None, normal Patient's awareness of abnormal movements (rate only patient's report): No Awareness, Dental Status Current problems with teeth and/or dentures?: No Does patient usually wear dentures?: No  CIWA:    COWS:     Musculoskeletal: Strength & Muscle Tone: within normal limits Gait & Station: normal Patient leans: N/A  Psychiatric Specialty Exam: Physical Exam  Nursing note and vitals reviewed. Constitutional: She appears well-developed and well-nourished.  HENT:  Head: Normocephalic and atraumatic.  Eyes: Pupils are equal, round, and reactive to light. Conjunctivae are normal.  Cardiovascular: Regular rhythm and normal heart sounds.  Respiratory: Effort normal.  GI: Soft.  Musculoskeletal:        General: Normal range of motion.     Cervical back: Normal range of motion.  Neurological: She is alert.  Skin: Skin is warm and dry.  Psychiatric: Her affect is blunt. Her speech is delayed. She is slowed. Thought content is paranoid. Cognition and memory are normal. She expresses impulsivity. She expresses no homicidal and no suicidal ideation.    Review of Systems  Constitutional: Negative.   HENT: Negative.   Eyes: Negative.   Respiratory: Negative.   Cardiovascular: Negative.   Gastrointestinal: Negative.   Musculoskeletal: Negative.   Skin: Negative.    Neurological: Negative.   Psychiatric/Behavioral: Negative.     Blood pressure (!) 161/122, pulse (!) 131, temperature 98 F (36.7 C), temperature source Oral, resp. rate 18, height 5\' 6"  (1.676 m), weight 86.2 kg, SpO2 100 %.Body mass index is 30.67 kg/m.  General Appearance: Casual  Eye Contact:  Fair  Speech:  Clear and Coherent  Volume:  Normal  Mood:  Euthymic  Affect:  Congruent  Thought Process:  Goal Directed  Orientation:  Full (Time, Place, and Person)  Thought Content:  Logical  Suicidal Thoughts:  No  Homicidal Thoughts:  No  Memory:  Immediate;   Fair Recent;   Fair Remote;   Fair  Judgement:  Impaired  Insight:  Lacking  Psychomotor Activity:  Normal  Concentration:  Concentration: Fair  Recall:  [employer] of Knowledge:  Fair  Language:  Fair  Akathisia:  No  Handed:  Right  AIMS (if indicated):     Assets:  Housing Physical Health Resilience  ADL's:  Intact  Cognition:  WNL  Sleep:  Number of Hours: 1.75  Have you used any form of tobacco in the last 30 days? (Cigarettes, Smokeless Tobacco, Cigars, and/or Pipes): No  Has this patient used any form of tobacco in the last 30 days? (Cigarettes, Smokeless Tobacco, Cigars, and/or Pipes) Yes, No  Blood Alcohol level:  Lab Results  Component Value Date   ETH <10 10/25/2019   ETH <5 09/23/2016    Metabolic Disorder Labs:  Lab Results  Component Value Date   HGBA1C 5.7 09/10/2019   No results found for: PROLACTIN Lab Results  Component Value Date   CHOL 168 09/10/2019   TRIG 100.0 09/10/2019   HDL 47.70 09/10/2019   CHOLHDL 4 09/10/2019   VLDL 20.0 09/10/2019   LDLCALC 100 (H) 09/10/2019    See Psychiatric Specialty Exam and Suicide Risk Assessment completed by Attending Physician prior to discharge.  Discharge destination:  Home  Is patient on multiple antipsychotic therapies at discharge:  No   Has Patient had three or more failed trials of antipsychotic monotherapy by history:   No  Recommended Plan for Multiple Antipsychotic Therapies: NA  Discharge Instructions    Diet - low sodium heart healthy   Complete by: As directed    Increase activity slowly   Complete by: As directed      Allergies as of 11/04/2019   No Known Allergies     Medication List    TAKE these medications     Indication  amLODipine 10 MG tablet Commonly known as: NORVASC Take 1 tablet (10 mg total) by mouth daily. Start taking on: Nov 05, 2019 What changed:   medication strength  how much to take  Indication: High Blood Pressure Disorder   gabapentin 100 MG capsule Commonly known as: NEURONTIN Take 1 capsule (100 mg total) by mouth 3 times/day as needed-between meals & bedtime (hot flashes). What changed:   how much to take  how to take this  when to take this  reasons to take this  additional instructions  Indication: Fibromyalgia Syndrome   lisinopril 10 MG tablet Commonly known as: ZESTRIL Take 1 tablet (10 mg total) by mouth daily. Start taking on: Nov 05, 2019  Indication: High Blood Pressure Disorder   metoprolol succinate 100 MG 24 hr tablet Commonly known as: TOPROL-XL Take 1 tablet (100 mg total) by mouth daily. Take with or immediately following a meal. Start taking on: Nov 05, 2019 What changed: additional instructions  Indication: High Blood Pressure Disorder   paliperidone 9 MG 24 hr tablet Commonly known as: INVEGA Take 1 tablet (9 mg total) by mouth at bedtime.  Indication: Schizoaffective Disorder        Follow-up recommendations:  Activity:  Activity as tolerated Diet:  Regular diet Other:  Follow-up with outpatient mental health treatment and continue on current medicine  Comments: Prescriptions given for medicine at discharge  Signed: Mordecai Rasmussen, MD 11/04/2019, 11:35 AM

## 2019-11-04 NOTE — Progress Notes (Signed)
Pt denies SI, HI and AVH. Pt was educated on dc plan and verbalizes understanding. Pt received belongings, prescriptions and dc plan. Torrie Mayers RN

## 2019-11-04 NOTE — BHH Suicide Risk Assessment (Signed)
[facility] Discharge Suicide Risk Assessment   Principal Problem: Acute psychosis (HCC) Discharge Diagnoses: Principal Problem:   Acute psychosis (HCC) Active Problems:   Hypertension   Lumbar radiculitis   Hot flashes   Delusion (HCC)   Total Time spent with patient: 30 minutes  Musculoskeletal: Strength & Muscle Tone: within normal limits Gait & Station: normal Patient leans: N/A  Psychiatric Specialty Exam: Review of Systems  Constitutional: Negative.   HENT: Negative.   Eyes: Negative.   Respiratory: Negative.   Cardiovascular: Negative.   Gastrointestinal: Negative.   Musculoskeletal: Negative.   Skin: Negative.   Neurological: Negative.   Psychiatric/Behavioral: Negative.     Blood pressure (!) 161/122, pulse (!) 131, temperature 98 F (36.7 C), temperature source Oral, resp. rate 18, height 5\' 6"  (1.676 m), weight 86.2 kg, SpO2 100 %.Body mass index is 30.67 kg/m.  General Appearance: Casual  Eye Contact::  Good  Speech:  Clear and Coherent409  Volume:  Decreased  Mood:  Euthymic  Affect:  Constricted  Thought Process:  Coherent  Orientation:  Full (Time, Place, and Person)  Thought Content:  Paranoid Ideation  Suicidal Thoughts:  No  Homicidal Thoughts:  No  Memory:  Immediate;   Fair Recent;   Fair Remote;   Fair  Judgement:  Impaired  Insight:  Shallow  Psychomotor Activity:  Normal  Concentration:  Fair  Recall:  [phone] of Knowledge:Fair  Language: Fair  Akathisia:  No  Handed:  Right  AIMS (if indicated):     Assets:  Communication Skills Housing Resilience  Sleep:  Number of Hours: 1.75  Cognition: WNL  ADL's:  Intact   Mental Status Per Nursing Assessment::   On Admission:  NA  Demographic Factors:  Unemployed  Loss Factors: Loss of significant relationship  Historical Factors: Impulsivity  Risk Reduction Factors:   Sense of responsibility to family and Positive social support  Continued Clinical Symptoms:  Currently  Psychotic  Cognitive Features That Contribute To Risk:  Thought constriction (tunnel vision)    Suicide Risk:  Minimal: No identifiable suicidal ideation.  Patients presenting with no risk factors but with morbid ruminations; may be classified as minimal risk based on the severity of the depressive symptoms    Plan Of Care/Follow-up recommendations:  Activity:  Activity as tolerated Diet:  Regular diet Other:  Strongly encourage patient to continue medication.  Although she continues to appear to be paranoid she has never voiced any suicidal ideation nor been aggressive and completely denies any suicidal or homicidal thought.  [phone], MD 11/04/2019, 11:29 AM

## 2019-11-04 NOTE — Plan of Care (Signed)
Pt denies depression, anxiety, SI, HI and AVH. Pt was educated on care plan and verbalizes understanding. Pt was encouraged to attend groups. Torrie Mayers RN Problem: Education: Goal: Knowledge of [facility] Education information/materials will improve Outcome: Progressing Goal: Emotional status will improve Outcome: Progressing Goal: Mental status will improve Outcome: Not Progressing Goal: Verbalization of understanding the information provided will improve Outcome: Progressing   Problem: Activity: Goal: Interest or engagement in activities will improve Outcome: Progressing Goal: Sleeping patterns will improve Outcome: Progressing   Problem: Coping: Goal: Ability to verbalize frustrations and anger appropriately will improve Outcome: Progressing Goal: Ability to demonstrate self-control will improve Outcome: Progressing   Problem: Health Behavior/Discharge Planning: Goal: Identification of resources available to assist in meeting health care needs will improve Outcome: Progressing Goal: Compliance with treatment plan for underlying cause of condition will improve Outcome: Progressing   Problem: Physical Regulation: Goal: Ability to maintain clinical measurements within normal limits will improve Outcome: Progressing   Problem: Safety: Goal: Periods of time without injury will increase Outcome: Progressing   Problem: Activity: Goal: Will verbalize the importance of balancing activity with adequate rest periods Outcome: Progressing   Problem: Education: Goal: Will be free of psychotic symptoms Outcome: Progressing Goal: Knowledge of the prescribed therapeutic regimen will improve Outcome: Progressing   Problem: Coping: Goal: Coping ability will improve Outcome: Progressing Goal: Will verbalize feelings Outcome: Progressing   Problem: Health Behavior/Discharge Planning: Goal: Compliance with prescribed medication regimen will improve Outcome:  Progressing   Problem: Nutritional: Goal: Ability to achieve adequate nutritional intake will improve Outcome: Progressing   Problem: Role Relationship: Goal: Ability to communicate needs accurately will improve Outcome: Progressing Goal: Ability to interact with others will improve Outcome: Progressing   Problem: Safety: Goal: Ability to redirect hostility and anger into socially appropriate behaviors will improve Outcome: Progressing Goal: Ability to remain free from injury will improve Outcome: Progressing   Problem: Self-Care: Goal: Ability to participate in self-care as condition permits will improve Outcome: Progressing   Problem: Self-Concept: Goal: Will verbalize positive feelings about self Outcome: Progressing

## 2019-11-04 NOTE — Progress Notes (Signed)
Recreation Therapy Notes  Date: 11/04/2019  Time: 9:30 am   Location: Craft room    Behavioral response: N/A   Intervention Topic: Necessities   Discussion/Intervention: Patient did not attend group.   Clinical Observations/Feedback:  Patient did not attend group.   Adelei Scobey LRT/CTRS        Rudy Luhmann 11/04/2019 11:49 AM

## 2019-11-04 NOTE — B[facility] Group Notes (Signed)
[facility] Group Notes:  (Nursing/MHT/Case Management/Adjunct)  Date:  11/04/2019  Time:  1:27 PM  Type of Therapy:  Psychoeducational Skills  Participation Level:  None  Participation Quality:  Inattentive  Affect:  Flat  Cognitive:  Appropriate  Insight:  None  Engagement in Group:  None  Modes of Intervention:  Discussion  Summary of Progress/Problems:  Landry Mellow 11/04/2019, 1:27 PM

## 2019-11-04 NOTE — Telephone Encounter (Signed)
I called patient LM asking her to call back to try to get her scheduled one day this week at 12p to f/u. Urgent referral has been placed to psychiatry since Claris Che does not manage these medications.

## 2019-11-04 NOTE — Telephone Encounter (Signed)
So sorry Marijo Conception is out of office today. Could you please assist Claris Che on this urgent referral for patient?

## 2019-11-04 NOTE — Progress Notes (Signed)
Had a [place] conversation about why patient is sitting in a chair and refusing to get in bed even though she looks really uncomfortable. Patient says she is separated and she believes her husband is trying to make people think she is mentally ill so he can take her house. I explained to patient that her behaviors --such as refusing to eat and drink, refusing to get in bed and refusing medication--are actually helping her husband's case, because it makes it seem she is paranoid delusional. She said she does not fear that the food is poisoned but that she used to weigh 300 lbs and now that she has worked on her diet and lost weight, she is afraid of getting fat again. I gave her information on her Hinda Glatter and explained that she is here for her mental health and not her blood pressure and explained that giving the medication a chance would help her to go home. She said she was afraid because the doctor told her she might never go home. She said she trusted this author, and took the [place], drank a sprite and got into bed. She admitted it was the first time she had taken any medication besides her blood pressure medication since she has been here, but stated that she trusted this author and kept the medication print-out I gave her. Denies any thoughts of suicide or homicide or hearing or seeing things other people do not. She does seem to be fearful of certain staff and mistrustful which she says is because she was made to think she was going home and then was not discharged when she thought she would be. She thought she was here to get her blood pressure under control and that once it was --she could leave, and then she was told she was not leaving, which made her distrust staff.

## 2019-11-06 ENCOUNTER — Other Ambulatory Visit: Payer: Self-pay | Admitting: Psychiatry

## 2019-11-06 MED ORDER — GABAPENTIN 100 MG PO CAPS: 100.0000 mg | ORAL_CAPSULE | Freq: Two times a day (BID) | ORAL | 1 refills | Status: DC | PRN

## 2019-11-06 MED ORDER — LISINOPRIL 10 MG PO TABS: 10.0000 mg | ORAL_TABLET | Freq: Every day | ORAL | 1 refills | Status: DC

## 2019-11-06 MED ORDER — METOPROLOL SUCCINATE ER 100 MG PO TB24: 100.0000 mg | ORAL_TABLET | Freq: Every day | ORAL | 1 refills | Status: DC

## 2019-11-06 MED ORDER — AMLODIPINE BESYLATE 10 MG PO TABS: 10.0000 mg | ORAL_TABLET | Freq: Every day | ORAL | 1 refills | Status: DC

## 2019-11-06 MED ORDER — PALIPERIDONE ER 9 MG PO TB24: 9.0000 mg | ORAL_TABLET | Freq: Every day | ORAL | 1 refills | Status: DC

## 2019-11-06 NOTE — Telephone Encounter (Signed)
Just FYI. Looks like psychiatry tried to call patient. She stated that she is not interested in seeing them right now she has too much going on. She is scheduled with you 6/8 at 11:30. She is aware you do not manage psych meds.

## 2019-11-15 NOTE — Progress Notes (Signed)
Subjective:    Patient ID: Erin Orr, female    DOB: Apr 25, 1961, 59 y.o.   MRN: [id number]  CC: Erin Orr is a 59 y.o. female who presents today for follow up.   HPI: Hospitalization follow up  Feels well today, no complaints  She is not taking paliperdone.  On auditory or visual hallucinations. Didn't feel confused. Sleeping well. No depression. NO si/hi. Doesn't feel she needs to see psychiatrist.  She states that her husband took her to the hospital because she changed her eating habits for the better, healthier  Has been eating healthier and not eating greasy food. Has lost weight intentionally.  States that she didn't 'stop eating'. Eating fresh foods and and at first, her family was not supportive of this.  However now states 'family is now eating how she is eating. '  Had been 300 pounds , over the last 2 years.    Hypercalcemia-Endocrine- Erin Orr 12/23/19  Elevated alk phos   Fatty liver seen on [place] 2016   aditted to hospital 10/26/19 and discharged 11/04/19 for paranoid thinking and disorganized behavior. Started on paliperidone 49m at bedtime by Erin Orr negative  HISTORY:  Past Medical History:  Diagnosis Date  . Hypertension    No past surgical history on file. Family History  Problem Relation Age of Onset  . Heart disease Brother        has 3 brothers  . Heart attack Brother   . Colon cancer Neg Hx   . Breast cancer Neg Hx     Allergies: Patient has no known allergies. Current Outpatient Medications on File Prior to Visit  Medication Sig Dispense Refill  . metoprolol succinate (TOPROL-XL) 100 MG 24 hr tablet Take 1 tablet (100 mg total) by mouth daily. Take with or immediately following a meal. 30 tablet 1   No current facility-administered medications on file prior to visit.    Social History   Tobacco Use  . Smoking status: Never Smoker  . Smokeless tobacco: Never Used  Vaping Use  . Vaping Use: Never used   Substance Use Topics  . Alcohol use: Never  . Drug use: Never    Review of Systems  Constitutional: Negative for chills and fever.  Respiratory: Negative for cough.   Cardiovascular: Negative for chest pain and palpitations.  Gastrointestinal: Negative for nausea and vomiting.      Objective:    BP 136/88 (BP Location: Left Arm, Patient Position: Sitting, Cuff Size: Large)   Pulse 62   Temp 97.9 F (36.6 C) (Temporal)   Resp 16   Ht 5' 6"  (1.676 m)   Wt 196 lb 3.2 oz (89 kg)   SpO2 98%   BMI 31.67 kg/m  BP Readings from Last 3 Encounters:  11/19/19 136/88  10/26/19 (!) 143/118  10/11/19 120/82   Wt Readings from Last 3 Encounters:  11/19/19 196 lb 3.2 oz (89 kg)  10/25/19 200 lb (90.7 kg)  10/11/19 202 lb 3.2 oz (91.7 kg)    Physical Exam Vitals reviewed.  Constitutional:      Appearance: She is well-developed.  Eyes:     Conjunctiva/sclera: Conjunctivae normal.  Cardiovascular:     Rate and Rhythm: Normal rate and regular rhythm.     Pulses: Normal pulses.     Heart sounds: Normal heart sounds.  Pulmonary:     Effort: Pulmonary effort is normal.     Breath sounds: Normal breath sounds. No wheezing, rhonchi or rales.  Skin:    General: Skin is warm and dry.  Neurological:     Mental Status: She is alert.  Psychiatric:        Speech: Speech normal.        Behavior: Behavior normal.        Thought Content: Thought content normal.     Comments: Appropriately dressed, well-groomed        Assessment & Plan:   Problem List Items Addressed This Visit      Cardiovascular and Mediastinum   Hypertension - Primary   Relevant Medications   amLODipine (NORVASC) 5 MG tablet     Other   Elevated alkaline phosphatase level    Based on fractionation, appears to be more liver etiology.  Normal GGT.  Pending ultrasound of liver.  At this time patient reluctant to pursue referral to gastroenterology, we will await results of ultrasound.      Relevant Orders    US ABDOMEN LIMITED RUQ   Psychosis ([place])    she denies any mental illness, history of psychosis and states she was in the hospital because of change of eating behavior.  She has no abnormal behaviors today, pleasant in our conversation.  She is not on paliperdone.  I have re- offered referral to psychiatry, she politely declines again today.  She does not feel this is necessary.  At this point, advised patient to remain in close contact with me and continue close follow-up appointments we continue to evaluate the need for psychiatric consult.           I have discontinued Erin Orr. Erin Orr's amLODipine, gabapentin, lisinopril, and paliperidone. I am also having her start on amLODipine. Additionally, I am having her maintain her metoprolol succinate.   Meds ordered this encounter  Medications  . amLODipine (NORVASC) 5 MG tablet    Sig: Take 1 tablet (5 mg total) by mouth at bedtime.    Dispense:  90 tablet    Refill:  3    Order Specific Question:   Supervising Provider    Answer:   Erin Orr [[id number]]    Return precautions given.   Risks, benefits, and alternatives of the medications and treatment plan prescribed today were discussed, and patient expressed understanding.   Education regarding symptom management and diagnosis given to patient on AVS.  Continue to follow with Erin Hawthorne, FNP for routine health maintenance.   Erin Orr and I agreed with plan.   Erin Paris, FNP

## 2019-11-18 ENCOUNTER — Ambulatory Visit
Admission: RE | Admit: 2019-11-18 | Discharge: 2019-11-18 | Disposition: A | Discharge status: Home / Self Care | Payer: Medicare HMO | Source: Ambulatory Visit | Attending: Family | Admitting: Family

## 2019-11-18 DIAGNOSIS — R922 Inconclusive mammogram: Secondary | ICD-10-CM | POA: Insufficient documentation

## 2019-11-18 DIAGNOSIS — Z1231 Encounter for screening mammogram for malignant neoplasm of breast: Secondary | ICD-10-CM | POA: Insufficient documentation

## 2019-11-18 DIAGNOSIS — Z7689 Persons encountering health services in other specified circumstances: Secondary | ICD-10-CM

## 2019-11-19 ENCOUNTER — Telehealth: Payer: Self-pay | Admitting: Family

## 2019-11-19 ENCOUNTER — Ambulatory Visit (INDEPENDENT_AMBULATORY_CARE_PROVIDER_SITE_OTHER): Payer: Medicare HMO | Admitting: Family

## 2019-11-19 ENCOUNTER — Encounter: Payer: Self-pay | Admitting: Family

## 2019-11-19 ENCOUNTER — Other Ambulatory Visit: Payer: Self-pay

## 2019-11-19 VITALS — BP 136/88 | HR 62 | Temp 97.9°F | Resp 16 | Ht 66.0 in | Wt 196.2 lb

## 2019-11-19 DIAGNOSIS — F29 Unspecified psychosis not due to a substance or known physiological condition: Secondary | ICD-10-CM

## 2019-11-19 DIAGNOSIS — R748 Abnormal levels of other serum enzymes: Secondary | ICD-10-CM

## 2019-11-19 DIAGNOSIS — I10 Essential (primary) hypertension: Secondary | ICD-10-CM | POA: Diagnosis not present

## 2019-11-19 MED ORDER — AMLODIPINE BESYLATE 5 MG PO TABS: 5.0000 mg | ORAL_TABLET | Freq: Every day | ORAL | 3 refills | Status: DC

## 2019-11-19 NOTE — Telephone Encounter (Signed)
Called pt to change appt from August to July. She needed a 1 month follow up.

## 2019-11-19 NOTE — Patient Instructions (Signed)
Ultrasound of liver Let us know if you dont hear back within a week in regards to an appointment being scheduled.   Start amlodipine ; please take in addition to the metoprolol  It is imperative that you are seen AT least twice per year for labs and monitoring. Monitor blood pressure at home and me 5-6 reading on separate days. Goal is less than 120/80, based on newest guidelines, however we certainly want to be less than 130/80;  if persistently higher, please make sooner follow up appointment so we can recheck you blood pressure and manage/ adjust medications.

## 2019-11-21 NOTE — Assessment & Plan Note (Signed)
Based on fractionation, appears to be more liver etiology.  Normal GGT.  Pending ultrasound of liver.  At this time patient reluctant to pursue referral to gastroenterology, we will await results of ultrasound.

## 2019-11-21 NOTE — Assessment & Plan Note (Signed)
she denies any mental illness, history of psychosis and states she was in the hospital because of change of eating behavior.  She has no abnormal behaviors today, pleasant in our conversation.  She is not on paliperdone.  I have re- offered referral to psychiatry, she politely declines again today.  She does not feel this is necessary.  At this point, advised patient to remain in close contact with me and continue close follow-up appointments we continue to evaluate the need for psychiatric consult.

## 2019-12-02 ENCOUNTER — Telehealth: Payer: Self-pay | Admitting: Family

## 2019-12-02 ENCOUNTER — Ambulatory Visit: Payer: Medicare HMO | Attending: Family

## 2019-12-02 NOTE — Telephone Encounter (Signed)
Imagining called pt no showed her Ultrasound today

## 2019-12-03 NOTE — Telephone Encounter (Signed)
Call patient Please asked her to reschedule her ultrasound of the right upper quadrant.  This is extremely important as her liver enzymes been elevated. Please ask rasheedah for number so patient can schedule   Alternatively, if she would like me to request a GI consult, I will place referral.

## 2019-12-03 NOTE — Telephone Encounter (Signed)
Left message to return call 

## 2019-12-04 ENCOUNTER — Encounter: Payer: Self-pay | Admitting: Family

## 2019-12-05 NOTE — Telephone Encounter (Signed)
Left message to return call 

## 2019-12-18 NOTE — Telephone Encounter (Signed)
Left message to call back. 3rd attempt to call patient.

## 2019-12-19 NOTE — Telephone Encounter (Signed)
Letter mailed

## 2019-12-19 NOTE — Telephone Encounter (Signed)
Mail letter Ms Hintz,  Please call office and schedule a follow up appointment with me to discuss pursuing ultrasound of your liver as we have discussed. We have been unable to reach you.  Rory Percy , NP

## 2019-12-23 ENCOUNTER — Ambulatory Visit: Payer: Medicare HMO | Admitting: Endocrinology

## 2019-12-24 ENCOUNTER — Ambulatory Visit: Payer: Medicare HMO | Admitting: Family

## 2019-12-24 DIAGNOSIS — Z0289 Encounter for other administrative examinations: Secondary | ICD-10-CM

## 2020-01-15 ENCOUNTER — Ambulatory Visit (INDEPENDENT_AMBULATORY_CARE_PROVIDER_SITE_OTHER): Payer: Medicare HMO | Admitting: Family

## 2020-01-15 ENCOUNTER — Encounter: Payer: Self-pay | Admitting: Family

## 2020-01-15 ENCOUNTER — Other Ambulatory Visit: Payer: Self-pay

## 2020-01-15 VITALS — BP 168/110 | HR 79 | Temp 98.7°F | Ht 65.98 in | Wt 184.2 lb

## 2020-01-15 DIAGNOSIS — R748 Abnormal levels of other serum enzymes: Secondary | ICD-10-CM

## 2020-01-15 DIAGNOSIS — I10 Essential (primary) hypertension: Secondary | ICD-10-CM | POA: Diagnosis not present

## 2020-01-15 LAB — URINALYSIS, ROUTINE W REFLEX MICROSCOPIC
Bilirubin Urine: NEGATIVE
Hgb urine dipstick: NEGATIVE
Ketones, ur: NEGATIVE
Leukocytes,Ua: NEGATIVE
Nitrite: NEGATIVE
Specific Gravity, Urine: 1.02 (ref 1.000–1.030)
Total Protein, Urine: NEGATIVE
Urine Glucose: NEGATIVE
Urobilinogen, UA: 2 — AB (ref 0.0–1.0)
pH: 6.5 (ref 5.0–8.0)

## 2020-01-15 LAB — COMPREHENSIVE METABOLIC PANEL
ALT: 9 U/L (ref 0–35)
AST: 12 U/L (ref 0–37)
Albumin: 4.2 g/dL (ref 3.5–5.2)
Alkaline Phosphatase: 107 U/L (ref 39–117)
BUN: 9 mg/dL (ref 6–23)
CO2: 29 mEq/L (ref 19–32)
Calcium: 11.1 mg/dL — ABNORMAL HIGH (ref 8.4–10.5)
Chloride: 107 mEq/L (ref 96–112)
Creatinine, Ser: 0.73 mg/dL (ref 0.40–1.20)
GFR: 98.4 mL/min (ref >60.00)
Glucose, Bld: 87 mg/dL (ref 70–99)
Potassium: 4 mEq/L (ref 3.5–5.1)
Sodium: 140 mEq/L (ref 135–145)
Total Bilirubin: 0.6 mg/dL (ref 0.2–1.2)
Total Protein: 7.4 g/dL (ref 6.0–8.3)

## 2020-01-15 MED ORDER — AMLODIPINE BESYLATE 10 MG PO TABS: 10.0000 mg | ORAL_TABLET | Freq: Every day | ORAL | 1 refills | Status: DC

## 2020-01-15 NOTE — Assessment & Plan Note (Signed)
Chronic.  Ultrasound was not done.  Reiterated the importance of having this done for evaluation of liver pathology.  I messaged our referral coordinator to have this scheduled as patient is in agreement today.   She will also see GI, consult has been placed.

## 2020-01-15 NOTE — Assessment & Plan Note (Signed)
Reiterated the importance of follow-up with endocrine; we have discussed this in the past and  Patient has had some hesitation.  She states today that she will go.  I sent a message to our referral coordinator to restart this process.  Will follow

## 2020-01-15 NOTE — Progress Notes (Signed)
Subjective:    Patient ID: Erin Orr, female    DOB: [birth year], 59 y.o.   MRN: [id number]  CC: Erin Orr is a 59 y.o. female who presents today for follow up.   HPI: Feels great today,  No complaints  HTN- compliant with norvasc, metoprolol.  His blood pressure cuff at home however does not recall readings at home.  Denies chest pain, shortness of breath, vision changes, headache    Elevated alkaline phosphatase Elevated calcium- referred to endocrine  DEXA 09/2016 Normal bone density US thyroid 02/2017 homogeneous thyroid gland modestly enlarged. No nodules or enlarged parathyroid gland.   HISTORY:  Past Medical History:  Diagnosis Date  . Hypertension    No past surgical history on file. Family History  Problem Relation Age of Onset  . Heart disease Brother        has 3 brothers  . Heart attack Brother   . Colon cancer Neg Hx   . Breast cancer Neg Hx     Allergies: Patient has no known allergies. Current Outpatient Medications on File Prior to Visit  Medication Sig Dispense Refill  . metoprolol succinate (TOPROL-XL) 100 MG 24 hr tablet Take 1 tablet (100 mg total) by mouth daily. Take with or immediately following a meal. 30 tablet 1   No current facility-administered medications on file prior to visit.    Social History   Tobacco Use  . Smoking status: Never Smoker  . Smokeless tobacco: Never Used  Vaping Use  . Vaping Use: Never used  Substance Use Topics  . Alcohol use: Never  . Drug use: Never    Review of Systems  Constitutional: Negative for chills and fever.  Eyes: Negative for visual disturbance.  Respiratory: Negative for cough.   Cardiovascular: Negative for chest pain and palpitations.  Gastrointestinal: Negative for nausea and vomiting.  Neurological: Negative for dizziness and headaches.      Objective:    BP (!) 168/110 (BP Location: Left Arm, Patient Position: Sitting)   Pulse 79   Temp 98.7 F (37.1 C)    Ht 5' 5.98" (1.676 m)   Wt 184 lb 3.2 oz (83.6 kg)   SpO2 99%   BMI 29.75 kg/m  BP Readings from Last 3 Encounters:  01/15/20 (!) 168/110  11/19/19 136/88  10/26/19 (!) 143/118   Wt Readings from Last 3 Encounters:  01/15/20 184 lb 3.2 oz (83.6 kg)  11/19/19 196 lb 3.2 oz (89 kg)  10/25/19 200 lb (90.7 kg)    Physical Exam Vitals reviewed.  Constitutional:      Appearance: She is well-developed.  HENT:     Mouth/Throat:     Pharynx: Uvula midline.  Eyes:     Conjunctiva/sclera: Conjunctivae normal.     Pupils: Pupils are equal, round, and reactive to light.     Comments: Fundus normal bilaterally.   Cardiovascular:     Rate and Rhythm: Normal rate and regular rhythm.     Pulses: Normal pulses.     Heart sounds: Normal heart sounds.  Pulmonary:     Effort: Pulmonary effort is normal.     Breath sounds: Normal breath sounds. No wheezing, rhonchi or rales.  Skin:    General: Skin is warm and dry.  Neurological:     Mental Status: She is alert.     Cranial Nerves: No cranial nerve deficit.     Sensory: No sensory deficit.     Deep Tendon Reflexes:     Reflex  Scores:      Bicep reflexes are 2+ on the right side and 2+ on the left side.      Patellar reflexes are 2+ on the right side and 2+ on the left side.    Comments: Grip equal and strong bilateral upper extremities. Gait strong and steady. Able to perform rapid alternating movement without difficulty.   Psychiatric:        Speech: Speech normal.        Behavior: Behavior normal.        Thought Content: Thought content normal.        Assessment & Plan:   Problem List Items Addressed This Visit      Cardiovascular and Mediastinum   Hypertension    Elevated.  Reassured by normal neurologic exam the absence of signs or symptoms to suggest hypertensive urgency or emergency at this time.  We will increase amlodipine and patient will monitor blood pressure at home.  She understands to call me with readings from  home so can further adjust antihypertensive regimen if needed.      Relevant Medications   amLODipine (NORVASC) 10 MG tablet   Other Relevant Orders   Comprehensive metabolic panel   Urinalysis, Routine w reflex microscopic     Other   Elevated alkaline phosphatase level - Primary    Chronic.  Ultrasound was not done.  Reiterated the importance of having this done for evaluation of liver pathology.  I messaged our referral coordinator to have this scheduled as patient is in agreement today.   She will also see GI, consult has been placed.       Relevant Orders   Ambulatory referral to Gastroenterology   Hypercalcemia    Reiterated the importance of follow-up with endocrine; we have discussed this in the past and  Patient has had some hesitation.  She states today that she will go.  I sent a message to our referral coordinator to restart this process.  Will follow          I have changed Erin Orr's amLODipine. I am also having her maintain her metoprolol succinate.   Meds ordered this encounter  Medications  . amLODipine (NORVASC) 10 MG tablet    Sig: Take 1 tablet (10 mg total) by mouth at bedtime.    Dispense:  90 tablet    Refill:  1    Order Specific Question:   Supervising Provider    Answer:   Sherlene Shams [[id number]]    Return precautions given.   Risks, benefits, and alternatives of the medications and treatment plan prescribed today were discussed, and patient expressed understanding.   Education regarding symptom management and diagnosis given to patient on AVS.  Continue to follow with Erin Orr for routine health maintenance.   Erin Orr and I agreed with plan.   Erin Plowman, Orr

## 2020-01-15 NOTE — Assessment & Plan Note (Addendum)
Elevated.  Reassured by normal neurologic exam the absence of signs or symptoms to suggest hypertensive urgency or emergency at this time.  We will increase amlodipine and patient will monitor blood pressure at home.  She understands to call me with readings from home so can further adjust antihypertensive regimen if needed.

## 2020-01-15 NOTE — Patient Instructions (Addendum)
Your blood pressure is quite high today. Please take an additional amlodipine 5mg  as we discussed as SOON as you get home for a total of 10mg  of amlodipine today. Going forward please take total of 10mg  amlodipine daily. I sent a new prescription for this.     Monitor blood pressure at home and me 5-6 reading on separate days. Goal is less than 120/80, based on newest guidelines, however we certainly want to be less than 130/80;  if persistently higher, please make sooner follow up appointment so we can recheck you blood pressure and manage/ adjust medications.  If you develop any worrisome symptoms such as chest pain, left arm numbness, headache, vision changes, shortness of breath as we discussed, please present to the nearest emergency room or call 911  I have placed referral to GI due to abnormal liver enzymes. I have also ordered ultrasound of your liver. I have also referred you back to endocrinology due to your elevated calcium  Let know if you dont hear back within a week in regards to an appointment being scheduled.    Heart Attack A heart attack occurs when blood and oxygen supply to the heart is cut off. A heart attack causes damage to the heart that cannot be fixed. A heart attack is also called a myocardial infarction, or MI. If you think you are having a heart attack, do not wait to see if the symptoms will go away. Get medical help right away. What are the causes? This condition may be caused by:  A fatty substance (plaque) in the blood vessels (arteries). This can block the flow of blood to the heart.  A blood clot in the blood vessels that go to the heart. The blood clot blocks blood flow.  Low blood pressure.  An abnormal heartbeat.  Some diseases, such as problems in red blood cells (anemia)orproblems in breathing (respiratory failure).  Tightening (spasm) of a blood vessel that cuts off blood to the heart.  A tear in a blood vessel of the heart.  High blood  pressure. What increases the risk? The following factors may make you more likely to develop this condition:  Aging. The older you are, the higher your risk.  Having a personal or family history of chest pain, heart attack, stroke, or narrowing of the arteries in the legs, arms, head, or stomach (peripheral artery disease).  Being female.  Smoking.  Not getting regular exercise.  Being overweight or obese.  Having high blood pressure.  Having high cholesterol.  Having diabetes.  Drinking too much alcohol.  Using illegal drugs, such as cocaine or methamphetamine. What are the signs or symptoms? Symptoms of this condition include:  Chest pain. It may feel like: ? Crushing or squeezing. ? Tightness, pressure, fullness, or heaviness.  Pain in the arm, neck, jaw, back, or upper body.  Shortness of breath.  Heartburn.  Upset stomach (indigestion).  Feeling like you may vomit (nauseous).  Cold sweats.  Feeling tired.  Sudden light-headedness. How is this treated? A heart attack must be treated as soon as possible. Treatment may include:  Medicines to: ? Break up or dissolve blood clots. ? Thin blood and help prevent blood clots. ? Treat blood pressure. ? Improve blood flow to the heart. ? Reduce pain. ? Reduce cholesterol.  Procedures to widen a blocked artery and keep it open.  Open heart surgery.  Receiving oxygen.  Making your heart strong again (cardiac rehabilitation) through exercise, education, and counseling. Follow these  instructions at home: Medicines  Take over-the-counter and prescription medicines only as told by your doctor. You may need to take medicine: ? To keep your blood from clotting too easily. ? To control blood pressure. ? To lower cholesterol. ? To control heart rhythms.  Do not take these medicines unless your doctor says it is okay: ? NSAIDs, such as ibuprofen. ? Supplements that have vitamin A, vitamin E, or  both. ? Hormone replacement therapy that has estrogen with or without progestin. Lifestyle      Do not use any products that have nicotine or tobacco, such as cigarettes, e-cigarettes, and chewing tobacco. If you need help quitting, ask your doctor.  Avoid secondhand smoke.  Exercise regularly. Ask your doctor about a cardiac rehab program.  Eat heart-healthy foods. Your doctor will tell you what foods to eat.  Stay at a healthy weight.  Lower your stress level.  Do not use illegal drugs. Alcohol use  Do not drink alcohol if: ? Your doctor tells you not to drink. ? You are pregnant, may be pregnant, or are planning to become pregnant.  If you drink alcohol: ? Limit how much you use to:  0-1 drink a day for women.  0-2 drinks a day for men. ? Know how much alcohol is in your drink. In the [place] one drink equals one 12 oz bottle of beer (355 mL), one 5 oz glass of wine (148 mL), or one 1 oz glass of hard liquor (44 mL). General instructions  Work with your doctor to treat other problems you may have, such as diabetes or high blood pressure.  Get screened for depression. Get treatment if needed.  Keep your vaccines up to date. Get the flu shot (influenza vaccine) every year.  Keep all follow-up visits as told by your doctor. This is important. Contact a doctor if:  You feel very sad.  You have trouble doing your daily activities. Get help right away if:  You have sudden, unexplained discomfort in your chest, arms, back, neck, jaw, or upper body.  You have shortness of breath.  You have sudden sweating or clammy skin.  You feel like you may vomit.  You vomit.  You feel tired or weak.  You get light-headed or dizzy.  You feel your heart beating fast.  You feel your heart skipping beats.  You have blood pressure that is higher than 180/120. These symptoms may be an emergency. Do not wait to see if the symptoms will go away. Get medical help right away.  Call your local emergency services (911 in the [place] Do not drive yourself to the hospital. Summary  A heart attack occurs when blood and oxygen supply to the heart is cut off.  Do not take NSAIDs unless your doctor says it is okay.  Do not smoke. Avoid secondhand smoke.  Exercise regularly. Ask your doctor about a cardiac rehab program. This information is not intended to replace advice given to you by your health care provider. Make sure you discuss any questions you have with your health care provider. Document Revised: 09/10/2018 Document Reviewed: 09/10/2018 Elsevier Patient Education  2020 [employer].  Managing Your Hypertension Hypertension is commonly called high blood pressure. This is when the force of your blood pressing against the walls of your arteries is too strong. Arteries are blood vessels that carry blood from your heart throughout your body. Hypertension forces the heart to work harder to pump blood, and may cause the arteries to become  narrow or stiff. Having untreated or uncontrolled hypertension can cause heart attack, stroke, kidney disease, and other problems. What are blood pressure readings? A blood pressure reading consists of a higher number over a lower number. Ideally, your blood pressure should be below 120/80. The first ("top") number is called the systolic pressure. It is a measure of the pressure in your arteries as your heart beats. The second ("bottom") number is called the diastolic pressure. It is a measure of the pressure in your arteries as the heart relaxes. What does my blood pressure reading mean? Blood pressure is classified into four stages. Based on your blood pressure reading, your health care provider may use the following stages to determine what type of treatment you need, if any. Systolic pressure and diastolic pressure are measured in a unit called mm Hg. Normal  Systolic pressure: below 120.  Diastolic pressure: below  80. Elevated  Systolic pressure: 120-129.  Diastolic pressure: below 80. Hypertension stage 1  Systolic pressure: 130-139.  Diastolic pressure: 80-89. Hypertension stage 2  Systolic pressure: 140 or above.  Diastolic pressure: 90 or above. What health risks are associated with hypertension? Managing your hypertension is an important responsibility. Uncontrolled hypertension can lead to:  A heart attack.  A stroke.  A weakened blood vessel (aneurysm).  Heart failure.  Kidney damage.  Eye damage.  Metabolic syndrome.  Memory and concentration problems. What changes can I make to manage my hypertension? Hypertension can be managed by making lifestyle changes and possibly by taking medicines. Your health care provider will help you make a plan to bring your blood pressure within a normal range. Eating and drinking   Eat a diet that is high in fiber and potassium, and low in salt (sodium), added sugar, and fat. An example eating plan is called the DASH (Dietary Approaches to Stop Hypertension) diet. To eat this way: ? Eat plenty of fresh fruits and vegetables. Try to fill half of your plate at each meal with fruits and vegetables. ? Eat whole grains, such as whole wheat pasta, brown rice, or whole grain bread. Fill about one quarter of your plate with whole grains. ? Eat low-fat diary products. ? Avoid fatty cuts of meat, processed or cured meats, and poultry with skin. Fill about one quarter of your plate with lean proteins such as fish, chicken without skin, beans, eggs, and tofu. ? Avoid premade and processed foods. These tend to be higher in sodium, added sugar, and fat.  Reduce your daily sodium intake. Most people with hypertension should eat less than 1,500 mg of sodium a day.  Limit alcohol intake to no more than 1 drink a day for nonpregnant women and 2 drinks a day for men. One drink equals 12 oz of beer, 5 oz of wine, or 1 oz of hard liquor. Lifestyle  Work  with your health care provider to maintain a healthy body weight, or to lose weight. Ask what an ideal weight is for you.  Get at least 30 minutes of exercise that causes your heart to beat faster (aerobic exercise) most days of the week. Activities may include walking, swimming, or biking.  Include exercise to strengthen your muscles (resistance exercise), such as weight lifting, as part of your weekly exercise routine. Try to do these types of exercises for 30 minutes at least 3 days a week.  Do not use any products that contain nicotine or tobacco, such as cigarettes and e-cigarettes. If you need help quitting, ask your health  care provider.  Control any Benner-term (chronic) conditions you have, such as high cholesterol or diabetes. Monitoring  Monitor your blood pressure at home as told by your health care provider. Your personal target blood pressure may vary depending on your medical conditions, your age, and other factors.  Have your blood pressure checked regularly, as often as told by your health care provider. Working with your health care provider  Review all the medicines you take with your health care provider because there may be side effects or interactions.  Talk with your health care provider about your diet, exercise habits, and other lifestyle factors that may be contributing to hypertension.  Visit your health care provider regularly. Your health care provider can help you create and adjust your plan for managing hypertension. Will I need medicine to control my blood pressure? Your health care provider may prescribe medicine if lifestyle changes are not enough to get your blood pressure under control, and if:  Your systolic blood pressure is 130 or higher.  Your diastolic blood pressure is 80 or higher. Take medicines only as told by your health care provider. Follow the directions carefully. Blood pressure medicines must be taken as prescribed. The medicine does not  work as well when you skip doses. Skipping doses also puts you at risk for problems. Contact a health care provider if:  You think you are having a reaction to medicines you have taken.  You have repeated (recurrent) headaches.  You feel dizzy.  You have swelling in your ankles.  You have trouble with your vision. Get help right away if:  You develop a severe headache or confusion.  You have unusual weakness or numbness, or you feel faint.  You have severe pain in your chest or abdomen.  You vomit repeatedly.  You have trouble breathing. Summary  Hypertension is when the force of blood pumping through your arteries is too strong. If this condition is not controlled, it may put you at risk for serious complications.  Your personal target blood pressure may vary depending on your medical conditions, your age, and other factors. For most people, a normal blood pressure is less than 120/80.  Hypertension is managed by lifestyle changes, medicines, or both. Lifestyle changes include weight loss, eating a healthy, low-sodium diet, exercising more, and limiting alcohol. This information is not intended to replace advice given to you by your health care provider. Make sure you discuss any questions you have with your health care provider. Document Revised: 09/21/2018 Document Reviewed: 04/27/2016 Elsevier Patient Education  2020 [employer]

## 2020-01-22 ENCOUNTER — Telehealth: Payer: Self-pay | Admitting: Family

## 2020-01-22 NOTE — Telephone Encounter (Signed)
Rejection Reason - Patient did not respond" [facility] said about 1 hour ago

## 2020-01-28 NOTE — Telephone Encounter (Signed)
noted 

## 2020-04-22 ENCOUNTER — Encounter: Payer: Self-pay | Admitting: Family

## 2020-04-22 ENCOUNTER — Ambulatory Visit (INDEPENDENT_AMBULATORY_CARE_PROVIDER_SITE_OTHER): Payer: Medicare HMO | Admitting: Family

## 2020-04-22 ENCOUNTER — Other Ambulatory Visit: Payer: Self-pay

## 2020-04-22 VITALS — BP 130/100 | HR 98 | Temp 98.8°F | Ht 65.98 in | Wt 175.4 lb

## 2020-04-22 DIAGNOSIS — I10 Essential (primary) hypertension: Secondary | ICD-10-CM | POA: Diagnosis not present

## 2020-04-22 DIAGNOSIS — R748 Abnormal levels of other serum enzymes: Secondary | ICD-10-CM | POA: Diagnosis not present

## 2020-04-22 DIAGNOSIS — Z23 Encounter for immunization: Secondary | ICD-10-CM | POA: Diagnosis not present

## 2020-04-22 NOTE — Assessment & Plan Note (Signed)
Elevated today however patient has not taking amlodipine 10mg  or metoprolol 100mg . Advised to take medication prior to visits. Advised to check blood pressure at local pharmacy and to have close interim follow up here to recheck as well.

## 2020-04-22 NOTE — Assessment & Plan Note (Signed)
Resolved at last visit. Will check again today. Patient is adamant that she will not pursue [place] of liver , consult with GI for colonoscopy or otherwise. Will follow

## 2020-04-22 NOTE — Progress Notes (Signed)
Subjective:    Patient ID: Erin Orr, female    DOB: [birth year], 60 y.o.   MRN: [id number]  CC: Erin Orr is a 59 y.o. female who presents today for follow up.   HPI: HTN- she hasnt taken amlodipine nor toprol today. Takes medication in the morning. On increased dose amldopine. No leg swelling, sob, cp.   Elevated alk phos - has declined again RUQ [place].  Elevated calcium - at last visit [place] discussion in regards to seeing endocrine  Working on weight loss with eating healthier food. No longer eating friends foods.   No depression, anxiety. No si/hi. She is sleeping well.  02/2017 US thyroid modestly enlarged, no nodules or enlarged parathyroid glands.  Normal dexa 2018  Colonoscopy UTD  Last consult with dr Gabriel Carina in 2018 regarding  H/o graves disease, vitamin d deficiency, hypercalcemia likely due to [facility]  She is not on calcium supplements  HISTORY:  Past Medical History:  Diagnosis Date  . Hypertension    History reviewed. No pertinent surgical history. Family History  Problem Relation Age of Onset  . Heart disease Brother        has 3 brothers  . Heart attack Brother   . Colon cancer Neg Hx   . Breast cancer Neg Hx     Allergies: Patient has no known allergies. Current Outpatient Medications on File Prior to Visit  Medication Sig Dispense Refill  . amLODipine (NORVASC) 10 MG tablet Take 1 tablet (10 mg total) by mouth at bedtime. 90 tablet 1  . metoprolol succinate (TOPROL-XL) 100 MG 24 hr tablet Take 1 tablet (100 mg total) by mouth daily. Take with or immediately following a meal. 30 tablet 1   No current facility-administered medications on file prior to visit.    Social History   Tobacco Use  . Smoking status: Never Smoker  . Smokeless tobacco: Never Used  Vaping Use  . Vaping Use: Never used  Substance Use Topics  . Alcohol use: Never  . Drug use: Never    Review of Systems  Constitutional: Negative for chills and fever.   Respiratory: Negative for cough and shortness of breath.   Cardiovascular: Negative for chest pain, palpitations and leg swelling.  Gastrointestinal: Negative for nausea and vomiting.  Psychiatric/Behavioral: Negative for self-injury, sleep disturbance and suicidal ideas. The patient is not nervous/anxious.       Objective:    BP (!) 130/100   Pulse 98   Temp 98.8 F (37.1 C)   Ht 5' 5.98" (1.676 m)   Wt 175 lb 6.4 oz (79.6 kg)   SpO2 98%   BMI 28.33 kg/m  BP Readings from Last 3 Encounters:  04/22/20 (!) 130/100  01/15/20 (!) 168/110  11/19/19 136/88   Wt Readings from Last 3 Encounters:  04/22/20 175 lb 6.4 oz (79.6 kg)  01/15/20 184 lb 3.2 oz (83.6 kg)  11/19/19 196 lb 3.2 oz (89 kg)    Physical Exam Vitals reviewed.  Constitutional:      Appearance: She is well-developed.  Eyes:     Conjunctiva/sclera: Conjunctivae normal.  Cardiovascular:     Rate and Rhythm: Normal rate and regular rhythm.     Pulses: Normal pulses.     Heart sounds: Normal heart sounds.  Pulmonary:     Effort: Pulmonary effort is normal.     Breath sounds: Normal breath sounds. No wheezing, rhonchi or rales.  Musculoskeletal:     Right lower leg: No edema.  Left lower leg: No edema.  Skin:    General: Skin is warm and dry.  Neurological:     Mental Status: She is alert.  Psychiatric:        Speech: Speech normal.        Behavior: Behavior normal.        Thought Content: Thought content normal.        Assessment & Plan:   Problem List Items Addressed This Visit      Cardiovascular and Mediastinum   Hypertension    Elevated today however patient has not taking amlodipine 39m or metoprolol 1066m Advised to take medication prior to visits. Advised to check blood pressure at local pharmacy and to have close interim follow up here to recheck as well.         Other   Elevated alkaline phosphatase level    Resolved at last visit. Will check again today. Patient is adamant that  she will not pursue [place] liver , consult with GI for colonoscopy or otherwise. Will follow      Hypercalcemia    Again, emphasized the importance to endocrine follow up. She declines at this time. Will continue to follow calcium. Advised to continue to abstain from all calcium supplementation.           I am having DeShelda JakesLong maintain her metoprolol succinate and amLODipine.   No orders of the defined types were placed in this encounter.   Return precautions given.   Risks, benefits, and alternatives of the medications and treatment plan prescribed today were discussed, and patient expressed understanding.   Education regarding symptom management and diagnosis given to patient on AVS.  Continue to follow with ArBurnard HawthorneFNP for routine health maintenance.   DeLouanna Rawong and I agreed with plan.   MaMable ParisFNP

## 2020-04-22 NOTE — Assessment & Plan Note (Signed)
Again, emphasized the importance to endocrine follow up. She declines at this time. Will continue to follow calcium. Advised to continue to abstain from all calcium supplementation.

## 2020-04-22 NOTE — Patient Instructions (Addendum)
Let me know when ready for colonoscopy and I will place referral.   Please take blood pressure medications prior to next visit  It is imperative that you are seen AT least twice per year for labs and monitoring.   Monitor blood pressure at home and me 5-6 reading on separate days. Goal is less than 120/80, based on newest guidelines, however we certainly want to be less than 130/80;  if persistently higher, please make sooner follow up appointment so we can recheck you blood pressure and manage/ adjust medications.  Nice to see you!   Managing Your Hypertension Hypertension is commonly called high blood pressure. This is when the force of your blood pressing against the walls of your arteries is too strong. Arteries are blood vessels that carry blood from your heart throughout your body. Hypertension forces the heart to work harder to pump blood, and may cause the arteries to become narrow or stiff. Having untreated or uncontrolled hypertension can cause heart attack, stroke, kidney disease, and other problems. What are blood pressure readings? A blood pressure reading consists of a higher number over a lower number. Ideally, your blood pressure should be below 120/80. The first ("top") number is called the systolic pressure. It is a measure of the pressure in your arteries as your heart beats. The second ("bottom") number is called the diastolic pressure. It is a measure of the pressure in your arteries as the heart relaxes. What does my blood pressure reading mean? Blood pressure is classified into four stages. Based on your blood pressure reading, your health care provider may use the following stages to determine what type of treatment you need, if any. Systolic pressure and diastolic pressure are measured in a unit called mm Hg. Normal  Systolic pressure: below 120.  Diastolic pressure: below 80. Elevated  Systolic pressure: 120-129.  Diastolic pressure: below 80. Hypertension stage  1  Systolic pressure: 130-139.  Diastolic pressure: 80-89. Hypertension stage 2  Systolic pressure: 140 or above.  Diastolic pressure: 90 or above. What health risks are associated with hypertension? Managing your hypertension is an important responsibility. Uncontrolled hypertension can lead to:  A heart attack.  A stroke.  A weakened blood vessel (aneurysm).  Heart failure.  Kidney damage.  Eye damage.  Metabolic syndrome.  Memory and concentration problems. What changes can I make to manage my hypertension? Hypertension can be managed by making lifestyle changes and possibly by taking medicines. Your health care provider will help you make a plan to bring your blood pressure within a normal range. Eating and drinking   Eat a diet that is high in fiber and potassium, and low in salt (sodium), added sugar, and fat. An example eating plan is called the DASH (Dietary Approaches to Stop Hypertension) diet. To eat this way: ? Eat plenty of fresh fruits and vegetables. Try to fill half of your plate at each meal with fruits and vegetables. ? Eat whole grains, such as whole wheat pasta, brown rice, or whole grain bread. Fill about one quarter of your plate with whole grains. ? Eat low-fat diary products. ? Avoid fatty cuts of meat, processed or cured meats, and poultry with skin. Fill about one quarter of your plate with lean proteins such as fish, chicken without skin, beans, eggs, and tofu. ? Avoid premade and processed foods. These tend to be higher in sodium, added sugar, and fat.  Reduce your daily sodium intake. Most people with hypertension should eat less than 1,500 mg of  sodium a day.  Limit alcohol intake to no more than 1 drink a day for nonpregnant women and 2 drinks a day for men. One drink equals 12 oz of beer, 5 oz of wine, or 1 oz of hard liquor. Lifestyle  Work with your health care provider to maintain a healthy body weight, or to lose weight. Ask what an  ideal weight is for you.  Get at least 30 minutes of exercise that causes your heart to beat faster (aerobic exercise) most days of the week. Activities may include walking, swimming, or biking.  Include exercise to strengthen your muscles (resistance exercise), such as weight lifting, as part of your weekly exercise routine. Try to do these types of exercises for 30 minutes at least 3 days a week.  Do not use any products that contain nicotine or tobacco, such as cigarettes and e-cigarettes. If you need help quitting, ask your health care provider.  Control any [place]-term (chronic) conditions you have, such as high cholesterol or diabetes. Monitoring  Monitor your blood pressure at home as told by your health care provider. Your personal target blood pressure may vary depending on your medical conditions, your age, and other factors.  Have your blood pressure checked regularly, as often as told by your health care provider. Working with your health care provider  Review all the medicines you take with your health care provider because there may be side effects or interactions.  Talk with your health care provider about your diet, exercise habits, and other lifestyle factors that may be contributing to hypertension.  Visit your health care provider regularly. Your health care provider can help you create and adjust your plan for managing hypertension. Will I need medicine to control my blood pressure? Your health care provider may prescribe medicine if lifestyle changes are not enough to get your blood pressure under control, and if:  Your systolic blood pressure is 130 or higher.  Your diastolic blood pressure is 80 or higher. Take medicines only as told by your health care provider. Follow the directions carefully. Blood pressure medicines must be taken as prescribed. The medicine does not work as well when you skip doses. Skipping doses also puts you at risk for problems. Contact a  health care provider if:  You think you are having a reaction to medicines you have taken.  You have repeated (recurrent) headaches.  You feel dizzy.  You have swelling in your ankles.  You have trouble with your vision. Get help right away if:  You develop a severe headache or confusion.  You have unusual weakness or numbness, or you feel faint.  You have severe pain in your chest or abdomen.  You vomit repeatedly.  You have trouble breathing. Summary  Hypertension is when the force of blood pumping through your arteries is too strong. If this condition is not controlled, it may put you at risk for serious complications.  Your personal target blood pressure may vary depending on your medical conditions, your age, and other factors. For most people, a normal blood pressure is less than 120/80.  Hypertension is managed by lifestyle changes, medicines, or both. Lifestyle changes include weight loss, eating a healthy, low-sodium diet, exercising more, and limiting alcohol. This information is not intended to replace advice given to you by your health care provider. Make sure you discuss any questions you have with your health care provider. Document Revised: 09/21/2018 Document Reviewed: 04/27/2016 Elsevier Patient Education  2020 [employer].

## 2020-06-03 ENCOUNTER — Telehealth: Payer: Self-pay

## 2020-06-03 ENCOUNTER — Ambulatory Visit (INDEPENDENT_AMBULATORY_CARE_PROVIDER_SITE_OTHER): Payer: Medicare HMO | Admitting: Family

## 2020-06-03 ENCOUNTER — Encounter: Payer: Self-pay | Admitting: Family

## 2020-06-03 ENCOUNTER — Other Ambulatory Visit: Payer: Self-pay

## 2020-06-03 VITALS — BP 132/90 | HR 87 | Temp 98.6°F | Ht 65.98 in | Wt 177.0 lb

## 2020-06-03 DIAGNOSIS — I10 Essential (primary) hypertension: Secondary | ICD-10-CM | POA: Diagnosis not present

## 2020-06-03 LAB — COMPREHENSIVE METABOLIC PANEL
ALT: 9 U/L (ref 0–35)
AST: 14 U/L (ref 0–37)
Albumin: 4.3 g/dL (ref 3.5–5.2)
Alkaline Phosphatase: 130 U/L — ABNORMAL HIGH (ref 39–117)
BUN: 11 mg/dL (ref 6–23)
CO2: 29 mEq/L (ref 19–32)
Calcium: 11.2 mg/dL — ABNORMAL HIGH (ref 8.4–10.5)
Chloride: 104 mEq/L (ref 96–112)
Creatinine, Ser: 0.78 mg/dL (ref 0.40–1.20)
GFR: 82.58 mL/min (ref >60.00)
Glucose, Bld: 83 mg/dL (ref 70–99)
Potassium: 3.8 mEq/L (ref 3.5–5.1)
Sodium: 139 mEq/L (ref 135–145)
Total Bilirubin: 0.7 mg/dL (ref 0.2–1.2)
Total Protein: 7.9 g/dL (ref 6.0–8.3)

## 2020-06-03 MED ORDER — CARVEDILOL 3.125 MG PO TABS: 3.1250 mg | ORAL_TABLET | Freq: Two times a day (BID) | ORAL | 3 refills | Status: DC

## 2020-06-03 NOTE — Assessment & Plan Note (Signed)
Uncontrolled. Continue amlodipine 10mg . Stop toprol and start Coreg for better HTN control. Close follow up.

## 2020-06-03 NOTE — Telephone Encounter (Signed)
LMTCB for lab results.  

## 2020-06-03 NOTE — Progress Notes (Signed)
Subjective:    Patient ID: Erin Orr, female    DOB: [birth year], 59 y.o.   MRN: [id number]  CC: Erin Orr is a 59 y.o. female who presents today for follow up.   HPI: Feels well today No complaints.  Has lost 30 lbs eating healthier and now 'maintaining. Walks 3-4 times per week and 20+ minutes each time.   Denies exertional chest pain or pressure, numbness or tingling radiating to left arm or jaw, palpitations, dizziness, frequent headaches, changes in vision, or shortness of breath.   HTN- taking amlodipine 10mg , toprol 100mg .   Doesn't drink caffeine. Non smoker.  No NSAIDs.  due for colonoscopy.  HISTORY:  Past Medical History:  Diagnosis Date  . Hypertension    History reviewed. No pertinent surgical history. Family History  Problem Relation Age of Onset  . Heart disease Brother        has 3 brothers  . Heart attack Brother   . Colon cancer Neg Hx   . Breast cancer Neg Hx     Allergies: Patient has no known allergies. Current Outpatient Medications on File Prior to Visit  Medication Sig Dispense Refill  . amLODipine (NORVASC) 10 MG tablet Take 1 tablet (10 mg total) by mouth at bedtime. 90 tablet 1   No current facility-administered medications on file prior to visit.    Social History   Tobacco Use  . Smoking status: Never Smoker  . Smokeless tobacco: Never Used  Vaping Use  . Vaping Use: Never used  Substance Use Topics  . Alcohol use: Never  . Drug use: Never    Review of Systems  Constitutional: Negative for chills and fever.  Respiratory: Negative for cough and shortness of breath.   Cardiovascular: Negative for chest pain, palpitations and leg swelling.  Gastrointestinal: Negative for nausea and vomiting.      Objective:    BP 132/90   Pulse 87   Temp 98.6 F (37 C)   Ht 5' 5.98" (1.676 m)   Wt 177 lb (80.3 kg)   SpO2 97%   BMI 28.59 kg/m  BP Readings from Last 3 Encounters:  06/03/20 132/90  04/22/20 (!)  130/100  01/15/20 (!) 168/110   Wt Readings from Last 3 Encounters:  06/03/20 177 lb (80.3 kg)  04/22/20 175 lb 6.4 oz (79.6 kg)  01/15/20 184 lb 3.2 oz (83.6 kg)    Physical Exam Vitals reviewed.  Constitutional:      Appearance: She is well-developed and well-nourished.  Eyes:     Conjunctiva/sclera: Conjunctivae normal.  Cardiovascular:     Rate and Rhythm: Normal rate and regular rhythm.     Pulses: Normal pulses.     Heart sounds: Normal heart sounds.  Pulmonary:     Effort: Pulmonary effort is normal.     Breath sounds: Normal breath sounds. No wheezing, rhonchi or rales.  Musculoskeletal:     Right lower leg: No edema.     Left lower leg: No edema.  Skin:    General: Skin is warm and dry.  Neurological:     Mental Status: She is alert.  Psychiatric:        Mood and Affect: Mood and affect normal.        Speech: Speech normal.        Behavior: Behavior normal.        Thought Content: Thought content normal.        Assessment & Plan:   Problem List Items  Addressed This Visit      Cardiovascular and Mediastinum   Hypertension - Primary    Uncontrolled. Continue amlodipine 10mg . Stop toprol and start Coreg for better HTN control. Close follow up.      Relevant Medications   carvedilol (COREG) 3.125 MG tablet   Other Relevant Orders   Comprehensive metabolic panel     Declines colonoscopy.   I have discontinued Litzy Dicker. Trigo's metoprolol succinate. I am also having her start on carvedilol. Additionally, I am having her maintain her amLODipine.   Meds ordered this encounter  Medications  . carvedilol (COREG) 3.125 MG tablet    Sig: Take 1 tablet (3.125 mg total) by mouth 2 (two) times daily with a meal.    Dispense:  60 tablet    Refill:  3    Order Specific Question:   Supervising Provider    Answer:   Tamsen Roers [[id number]]    Return precautions given.   Risks, benefits, and alternatives of the medications and treatment plan prescribed today  were discussed, and patient expressed understanding.   Education regarding symptom management and diagnosis given to patient on AVS.  Continue to follow with Sherlene Shams, FNP for routine health maintenance.   Allegra Grana Walle and I agreed with plan.   Audria Nine, FNP

## 2020-06-03 NOTE — Patient Instructions (Signed)
Blood pressure is not to goal STOP metoprolol succinate.  START carvedilol Please call me with ANY questions.    Managing Your Hypertension Hypertension is commonly called high blood pressure. This is when the force of your blood pressing against the walls of your arteries is too strong. Arteries are blood vessels that carry blood from your heart throughout your body. Hypertension forces the heart to work harder to pump blood, and may cause the arteries to become narrow or stiff. Having untreated or uncontrolled hypertension can cause heart attack, stroke, kidney disease, and other problems. What are blood pressure readings? A blood pressure reading consists of a higher number over a lower number. Ideally, your blood pressure should be below 120/80. The first ("top") number is called the systolic pressure. It is a measure of the pressure in your arteries as your heart beats. The second ("bottom") number is called the diastolic pressure. It is a measure of the pressure in your arteries as the heart relaxes. What does my blood pressure reading mean? Blood pressure is classified into four stages. Based on your blood pressure reading, your health care provider may use the following stages to determine what type of treatment you need, if any. Systolic pressure and diastolic pressure are measured in a unit called mm Hg. Normal  Systolic pressure: below 120.  Diastolic pressure: below 80. Elevated  Systolic pressure: 120-129.  Diastolic pressure: below 80. Hypertension stage 1  Systolic pressure: 130-139.  Diastolic pressure: 80-89. Hypertension stage 2  Systolic pressure: 140 or above.  Diastolic pressure: 90 or above. What health risks are associated with hypertension? Managing your hypertension is an important responsibility. Uncontrolled hypertension can lead to:  A heart attack.  A stroke.  A weakened blood vessel (aneurysm).  Heart failure.  Kidney damage.  Eye  damage.  Metabolic syndrome.  Memory and concentration problems. What changes can I make to manage my hypertension? Hypertension can be managed by making lifestyle changes and possibly by taking medicines. Your health care provider will help you make a plan to bring your blood pressure within a normal range. Eating and drinking   Eat a diet that is high in fiber and potassium, and low in salt (sodium), added sugar, and fat. An example eating plan is called the DASH (Dietary Approaches to Stop Hypertension) diet. To eat this way: ? Eat plenty of fresh fruits and vegetables. Try to fill half of your plate at each meal with fruits and vegetables. ? Eat whole grains, such as whole wheat pasta, brown rice, or whole grain bread. Fill about one quarter of your plate with whole grains. ? Eat low-fat diary products. ? Avoid fatty cuts of meat, processed or cured meats, and poultry with skin. Fill about one quarter of your plate with lean proteins such as fish, chicken without skin, beans, eggs, and tofu. ? Avoid premade and processed foods. These tend to be higher in sodium, added sugar, and fat.  Reduce your daily sodium intake. Most people with hypertension should eat less than 1,500 mg of sodium a day.  Limit alcohol intake to no more than 1 drink a day for nonpregnant women and 2 drinks a day for men. One drink equals 12 oz of beer, 5 oz of wine, or 1 oz of hard liquor. Lifestyle  Work with your health care provider to maintain a healthy body weight, or to lose weight. Ask what an ideal weight is for you.  Get at least 30 minutes of exercise that causes your  heart to beat faster (aerobic exercise) most days of the week. Activities may include walking, swimming, or biking.  Include exercise to strengthen your muscles (resistance exercise), such as weight lifting, as part of your weekly exercise routine. Try to do these types of exercises for 30 minutes at least 3 days a week.  Do not use any  products that contain nicotine or tobacco, such as cigarettes and e-cigarettes. If you need help quitting, ask your health care provider.  Control any [place]-term (chronic) conditions you have, such as high cholesterol or diabetes. Monitoring  Monitor your blood pressure at home as told by your health care provider. Your personal target blood pressure may vary depending on your medical conditions, your age, and other factors.  Have your blood pressure checked regularly, as often as told by your health care provider. Working with your health care provider  Review all the medicines you take with your health care provider because there may be side effects or interactions.  Talk with your health care provider about your diet, exercise habits, and other lifestyle factors that may be contributing to hypertension.  Visit your health care provider regularly. Your health care provider can help you create and adjust your plan for managing hypertension. Will I need medicine to control my blood pressure? Your health care provider may prescribe medicine if lifestyle changes are not enough to get your blood pressure under control, and if:  Your systolic blood pressure is 130 or higher.  Your diastolic blood pressure is 80 or higher. Take medicines only as told by your health care provider. Follow the directions carefully. Blood pressure medicines must be taken as prescribed. The medicine does not work as well when you skip doses. Skipping doses also puts you at risk for problems. Contact a health care provider if:  You think you are having a reaction to medicines you have taken.  You have repeated (recurrent) headaches.  You feel dizzy.  You have swelling in your ankles.  You have trouble with your vision. Get help right away if:  You develop a severe headache or confusion.  You have unusual weakness or numbness, or you feel faint.  You have severe pain in your chest or abdomen.  You vomit  repeatedly.  You have trouble breathing. Summary  Hypertension is when the force of blood pumping through your arteries is too strong. If this condition is not controlled, it may put you at risk for serious complications.  Your personal target blood pressure may vary depending on your medical conditions, your age, and other factors. For most people, a normal blood pressure is less than 120/80.  Hypertension is managed by lifestyle changes, medicines, or both. Lifestyle changes include weight loss, eating a healthy, low-sodium diet, exercising more, and limiting alcohol. This information is not intended to replace advice given to you by your health care provider. Make sure you discuss any questions you have with your health care provider. Document Revised: 09/21/2018 Document Reviewed: 04/27/2016 Elsevier Patient Education  2020 [employer].

## 2020-06-09 NOTE — Progress Notes (Signed)
LMTCB

## 2020-07-15 ENCOUNTER — Ambulatory Visit: Payer: Medicare HMO | Admitting: Family

## 2020-08-05 ENCOUNTER — Ambulatory Visit (INDEPENDENT_AMBULATORY_CARE_PROVIDER_SITE_OTHER): Payer: Medicare HMO | Admitting: Family

## 2020-08-05 ENCOUNTER — Other Ambulatory Visit: Payer: Self-pay

## 2020-08-05 VITALS — BP 141/80 | HR 91 | Temp 98.9°F | Resp 16 | Wt 176.0 lb

## 2020-08-05 DIAGNOSIS — I10 Essential (primary) hypertension: Secondary | ICD-10-CM

## 2020-08-05 MED ORDER — LOSARTAN POTASSIUM 50 MG PO TABS: 50.0000 mg | ORAL_TABLET | Freq: Every day | ORAL | 1 refills | Status: DC

## 2020-08-05 NOTE — Progress Notes (Signed)
Subjective:    Patient ID: Erin Orr, female    DOB: 1960-08-25, 60 y.o.   MRN: [id number]  CC: Erin Orr is a 60 y.o. female who presents today for follow up.   HPI: Feels well today.  Here to follow up on HTN. She has been compliant with amlodipine.  She stopped coreg as she an episode of vomiting when she started medication and she is not sure related to medication or thinks may have been something she ate.  Vomiting has resolved.    HISTORY:  Past Medical History:  Diagnosis Date  . Hypertension    No past surgical history on file. Family History  Problem Relation Age of Onset  . Heart disease Brother        has 3 brothers  . Heart attack Brother   . Colon cancer Neg Hx   . Breast cancer Neg Hx     Allergies: Patient has no known allergies. Current Outpatient Medications on File Prior to Visit  Medication Sig Dispense Refill  . amLODipine (NORVASC) 10 MG tablet Take 1 tablet (10 mg total) by mouth at bedtime. 90 tablet 1   No current facility-administered medications on file prior to visit.    Social History   Tobacco Use  . Smoking status: Never Smoker  . Smokeless tobacco: Never Used  Vaping Use  . Vaping Use: Never used  Substance Use Topics  . Alcohol use: Never  . Drug use: Never    Review of Systems  Constitutional: Negative for chills and fever.  Respiratory: Negative for cough.   Cardiovascular: Negative for chest pain and palpitations.  Gastrointestinal: Negative for nausea and vomiting.      Objective:    BP (!) 141/80   Pulse 91   Temp 98.9 F (37.2 C) (Oral)   Resp 16   Wt 176 lb (79.8 kg)   SpO2 99%   BMI 28.42 kg/m  BP Readings from Last 3 Encounters:  08/05/20 (!) 141/80  06/03/20 132/90  04/22/20 (!) 130/100   Wt Readings from Last 3 Encounters:  08/05/20 176 lb (79.8 kg)  06/03/20 177 lb (80.3 kg)  04/22/20 175 lb 6.4 oz (79.6 kg)    Physical Exam Vitals reviewed.  Constitutional:       Appearance: She is well-developed and well-nourished.  Eyes:     Conjunctiva/sclera: Conjunctivae normal.  Cardiovascular:     Rate and Rhythm: Normal rate and regular rhythm.     Pulses: Normal pulses.     Heart sounds: Normal heart sounds.  Pulmonary:     Effort: Pulmonary effort is normal.     Breath sounds: Normal breath sounds. No wheezing, rhonchi or rales.  Skin:    General: Skin is warm and dry.  Neurological:     Mental Status: She is alert.  Psychiatric:        Mood and Affect: Mood and affect normal.        Speech: Speech normal.        Behavior: Behavior normal.        Thought Content: Thought content normal.        Assessment & Plan:   Problem List Items Addressed This Visit      Cardiovascular and Mediastinum   Hypertension - Primary    Not well controlled. Continue amlodipine 10mg ; start losartan 50mg . Labs in one week.      Relevant Medications   losartan (COZAAR) 50 MG tablet   Other Relevant Orders  Comprehensive metabolic panel   CBC with Differential/Platelet   Hemoglobin A1c   Lipid panel   VITAMIN D 25 Hydroxy (Vit-D Deficiency, Fractures)       I have discontinued Keylee Vannorman. Mignano's carvedilol. I am also having her start on losartan. Additionally, I am having her maintain her amLODipine.   Meds ordered this encounter  Medications  . losartan (COZAAR) 50 MG tablet    Sig: Take 1 tablet (50 mg total) by mouth daily.    Dispense:  90 tablet    Refill:  1    Order Specific Question:   Supervising Provider    Answer:   Sherlene Shams [[id number]]    Return precautions given.   Risks, benefits, and alternatives of the medications and treatment plan prescribed today were discussed, and patient expressed understanding.   Education regarding symptom management and diagnosis given to patient on AVS.  Continue to follow with Allegra Grana, FNP for routine health maintenance.   Audria Nine Anne and I agreed with plan.   Rennie Plowman, FNP

## 2020-08-05 NOTE — Patient Instructions (Signed)
Continue amlodipine 10mg  Start losartan 50mg  in addition to for blood pressure control Fasting labs in ONE week  It is imperative that you are seen AT least twice per year for labs and monitoring. Monitor blood pressure at home and me 5-6 reading on separate days. Goal is less than 120/80, based on newest guidelines, however we certainly want to be less than 130/80;  if persistently higher, please make sooner follow up appointment so we can recheck you blood pressure and manage/ adjust medications.

## 2020-08-07 NOTE — Assessment & Plan Note (Signed)
Not well controlled. Continue amlodipine 10mg ; start losartan 50mg . Labs in one week.

## 2020-08-12 ENCOUNTER — Other Ambulatory Visit: Payer: Medicare HMO

## 2020-09-16 ENCOUNTER — Other Ambulatory Visit: Payer: Self-pay

## 2020-09-16 ENCOUNTER — Ambulatory Visit (INDEPENDENT_AMBULATORY_CARE_PROVIDER_SITE_OTHER): Payer: Medicare HMO | Admitting: Family

## 2020-09-16 ENCOUNTER — Encounter: Payer: Self-pay | Admitting: Family

## 2020-09-16 VITALS — BP 124/90 | HR 98 | Temp 99.0°F | Ht 65.98 in | Wt 166.4 lb

## 2020-09-16 DIAGNOSIS — I10 Essential (primary) hypertension: Secondary | ICD-10-CM

## 2020-09-16 LAB — CBC WITH DIFFERENTIAL/PLATELET
Basophils Absolute: 0 10*3/uL (ref 0.0–0.1)
Basophils Relative: 0.1 % (ref 0.0–3.0)
Eosinophils Absolute: 0 10*3/uL (ref 0.0–0.7)
Eosinophils Relative: 0 % (ref 0.0–5.0)
HCT: 41.7 % (ref 36.0–46.0)
Hemoglobin: 13.7 g/dL (ref 12.0–15.0)
Lymphocytes Relative: 33.2 % (ref 12.0–46.0)
Lymphs Abs: 1.4 10*3/uL (ref 0.7–4.0)
MCHC: 32.8 g/dL (ref 30.0–36.0)
MCV: 87 fl (ref 78.0–100.0)
Monocytes Absolute: 0.3 10*3/uL (ref 0.1–1.0)
Monocytes Relative: 8.1 % (ref 3.0–12.0)
Neutro Abs: 2.4 10*3/uL (ref 1.4–7.7)
Neutrophils Relative %: 58.6 % (ref 43.0–77.0)
Platelets: 259 10*3/uL (ref 150.0–400.0)
RBC: 4.8 Mil/uL (ref 3.87–5.11)
RDW: 13.4 % (ref 11.5–15.5)
WBC: 4.1 10*3/uL (ref 4.0–10.5)

## 2020-09-16 LAB — COMPREHENSIVE METABOLIC PANEL
ALT: 7 U/L (ref 0–35)
AST: 13 U/L (ref 0–37)
Albumin: 4.5 g/dL (ref 3.5–5.2)
Alkaline Phosphatase: 122 U/L — ABNORMAL HIGH (ref 39–117)
BUN: 10 mg/dL (ref 6–23)
CO2: 30 mEq/L (ref 19–32)
Calcium: 11.6 mg/dL — ABNORMAL HIGH (ref 8.4–10.5)
Chloride: 104 mEq/L (ref 96–112)
Creatinine, Ser: 0.77 mg/dL (ref 0.40–1.20)
GFR: 83.7 mL/min (ref >60.00)
Glucose, Bld: 78 mg/dL (ref 70–99)
Potassium: 4 mEq/L (ref 3.5–5.1)
Sodium: 140 mEq/L (ref 135–145)
Total Bilirubin: 0.7 mg/dL (ref 0.2–1.2)
Total Protein: 7.8 g/dL (ref 6.0–8.3)

## 2020-09-16 LAB — LIPID PANEL
Cholesterol: 168 mg/dL (ref 0–200)
HDL: 61.3 mg/dL (ref >39.00)
LDL Cholesterol: 93 mg/dL (ref 0–99)
NonHDL: 106.92
Total CHOL/HDL Ratio: 3
Triglycerides: 72 mg/dL (ref 0.0–149.0)
VLDL: 14.4 mg/dL (ref 0.0–40.0)

## 2020-09-16 LAB — VITAMIN D 25 HYDROXY (VIT D DEFICIENCY, FRACTURES): VITD: 17.94 ng/mL — ABNORMAL LOW (ref 30.00–100.00)

## 2020-09-16 LAB — HEMOGLOBIN A1C: Hgb A1c MFr Bld: 5.5 % (ref 4.6–6.5)

## 2020-09-16 MED ORDER — LOSARTAN POTASSIUM 100 MG PO TABS: 100.0000 mg | ORAL_TABLET | Freq: Every day | ORAL | 3 refills | Status: DC

## 2020-09-16 NOTE — Patient Instructions (Addendum)
Continue amlodipine 10mg  Increase losartan to 100mg   Labs IN ONE WEEK  This is  Dr.  example of a  "Low GI"  Diet:  It will allow you to lose 4 to 8  lbs  per month if you follow it carefully.  Your goal with exercise is a minimum of 30 minutes of aerobic exercise 5 days per week (Walking does not count once it becomes easy!)    All of the foods can be found at grocery stores and in bulk at .  The Atkins protein bars and shakes are available in more varieties at Target, [employer] and Lowe's Foods.     7 AM Breakfast:  Choose from the following:  Low carbohydrate Protein  Shakes (I recommend the  Premier Protein chocolate shakes,  EAS AdvantEdge "Carb Control" shakes  Or the Atkins shakes all are under 3 net carbs)     a scrambled egg/bacon/cheese burrito made with Mission's "carb balance" whole wheat tortilla  (about 10 net carbs )  Melina Schools (basically a quiche without the pastry crust) that is eaten cold and very convenient way to get your eggs.  8 carbs)  If you make your own protein shakes, avoid bananas and pineapple,  And use low carb greek yogurt or original /unsweetened almond or soy milk    Avoid cereal and bananas, oatmeal and cream of wheat and grits. They are loaded with carbohydrates!   10 AM: high protein snack:  Protein bar by Atkins (the snack size, under 200 cal, usually < 6 net carbs).    A stick of cheese:  Around 1 carb,  100 cal     Dannon Light n Fit [employer] Yogurt  (80 cal, 8 carbs)  Other so called "protein bars" and Greek yogurts tend to be loaded with carbohydrates.  Remember, in food advertising, the word "energy" is synonymous for " carbohydrate."  Lunch:   A Sandwich using the bread choices listed, Can use any  Eggs,  lunchmeat, grilled meat or canned tuna), avocado, regular mayo/mustard  and cheese.  A Salad using blue cheese, ranch,  Goddess or vinagrette,  Avoid taco shells, croutons or "confetti" and no "candied  nuts" but regular nuts OK.   No pretzels, nabs  or chips.  Pickles and miniature sweet peppers are a good low carb alternative that provide a "crunch"  The bread is the only source of carbohydrate in a sandwich and  can be decreased by trying some of the attached alternatives to traditional loaf bread   Avoid "Low fat dressings, as well as Medical laboratory scientific officer and [place] dressings They are loaded with sugar!   3 PM/ Mid day  Snack:  Consider  1 ounce of  almonds, walnuts, pistachios, pecans, peanuts,  Macadamia nuts or a nut medley.  Avoid "granola and granola bars "  Mixed nuts are ok in moderation as Stiggers as there are no raisins,  cranberries or dried fruit.   KIND bars are OK if you get the low glycemic index variety   Try the prosciutto/mozzarella cheese sticks by Fiorruci  In deli /backery section   High protein      6 PM  Dinner:     Meat/fowl/fish with a green salad, and either broccoli, cauliflower, green beans, spinach, brussel sprouts or  Lima beans. DO NOT BREAD THE PROTEIN!!      There is a low carb pasta by Dreamfield's that is acceptable and tastes great: only 5 digestible carbs/serving.(  All grocery stores but BJs carry it ) Several ready made meals are available low carb:   Try Michel Angelo's chicken piccata or chicken or eggplant parm over low carb pasta.(Lowes and BJs)   Clifton Custard Sanchez's "Carnitas" (pulled pork, no sauce,  0 carbs) or his beef pot roast to make a dinner burrito (at BJ's)  Pesto over low carb pasta (bj's sells a good quality pesto in the center refrigerated section of the deli   Try satueeing  Roosvelt Harps with mushroooms as a good side   Green Giant makes a mashed cauliflower that tastes like mashed potatoes  Whole wheat pasta is still full of digestible carbs and  Not as low in glycemic index as Dreamfield's.   Brown rice is still rice,  So skip the rice and noodles if you eat [place] or [place] (or at least limit to 1/2 cup)  9 PM snack :   Breyer's "low carb"  fudgsicle or  ice cream bar (Carb Smart line), or  Weight Watcher's ice cream bar , or another "no sugar added" ice cream;  a serving of fresh berries/cherries with whipped cream   Cheese or DANNON'S LlGHT N FIT GREEK YOGURT  8 ounces of [place] unsweetened almond/cococunut milk    Treat yourself to a parfait made with whipped cream blueberiies, walnuts and vanilla greek yogurt  Avoid bananas, pineapple, grapes  and watermelon on a regular basis because they are high in sugar.  THINK OF THEM AS DESSERT  Remember that snack Substitutions should be less than 10 NET carbs per serving and meals < 20 carbs. Remember to subtract fiber grams to get the "net carbs."   Managing Your Hypertension Hypertension, also called high blood pressure, is when the force of the blood pressing against the walls of the arteries is too strong. Arteries are blood vessels that carry blood from your heart throughout your body. Hypertension forces the heart to work harder to pump blood and may cause the arteries to become narrow or stiff. Understanding blood pressure readings Your personal target blood pressure may vary depending on your medical conditions, your age, and other factors. A blood pressure reading includes a higher number over a lower number. Ideally, your blood pressure should be below 120/80. You should know that:  The first, or top, number is called the systolic pressure. It is a measure of the pressure in your arteries as your heart beats.  The second, or bottom number, is called the diastolic pressure. It is a measure of the pressure in your arteries as the heart relaxes. Blood pressure is classified into four stages. Based on your blood pressure reading, your health care provider may use the following stages to determine what type of treatment you need, if any. Systolic pressure and diastolic pressure are measured in a unit called mmHg. Normal  Systolic pressure: below 120.  Diastolic pressure:  below 80. Elevated  Systolic pressure: 120-129.  Diastolic pressure: below 80. Hypertension stage 1  Systolic pressure: 130-139.  Diastolic pressure: 80-89. Hypertension stage 2  Systolic pressure: 140 or above.  Diastolic pressure: 90 or above. How can this condition affect me? Managing your hypertension is an important responsibility. Over time, hypertension can damage the arteries and decrease blood flow to important parts of the body, including the brain, heart, and kidneys. Having untreated or uncontrolled hypertension can lead to:  A heart attack.  A stroke.  A weakened blood vessel (aneurysm).  Heart failure.  Kidney damage.  Eye damage.  Metabolic syndrome.  Memory and concentration problems.  Vascular dementia. What actions can I take to manage this condition? Hypertension can be managed by making lifestyle changes and possibly by taking medicines. Your health care provider will help you make a plan to bring your blood pressure within a normal range. Nutrition  Eat a diet that is high in fiber and potassium, and low in salt (sodium), added sugar, and fat. An example eating plan is called the Dietary Approaches to Stop Hypertension (DASH) diet. To eat this way: ? Eat plenty of fresh fruits and vegetables. Try to fill one-half of your plate at each meal with fruits and vegetables. ? Eat whole grains, such as whole-wheat pasta, brown rice, or whole-grain bread. Fill about one-fourth of your plate with whole grains. ? Eat low-fat dairy products. ? Avoid fatty cuts of meat, processed or cured meats, and poultry with skin. Fill about one-fourth of your plate with lean proteins such as fish, chicken without skin, beans, eggs, and tofu. ? Avoid pre-made and processed foods. These tend to be higher in sodium, added sugar, and fat.  Reduce your daily sodium intake. Most people with hypertension should eat less than 1,500 mg of sodium a day.   Lifestyle  Work with your  health care provider to maintain a healthy body weight or to lose weight. Ask what an ideal weight is for you.  Get at least 30 minutes of exercise that causes your heart to beat faster (aerobic exercise) most days of the week. Activities may include walking, swimming, or biking.  Include exercise to strengthen your muscles (resistance exercise), such as weight lifting, as part of your weekly exercise routine. Try to do these types of exercises for 30 minutes at least 3 days a week.  Do not use any products that contain nicotine or tobacco, such as cigarettes, e-cigarettes, and chewing tobacco. If you need help quitting, ask your health care provider.  Control any Evangelist-term (chronic) conditions you have, such as high cholesterol or diabetes.  Identify your sources of stress and find ways to manage stress. This may include meditation, deep breathing, or making time for fun activities.   Alcohol use  Do not drink alcohol if: ? Your health care provider tells you not to drink. ? You are pregnant, may be pregnant, or are planning to become pregnant.  If you drink alcohol: ? Limit how much you use to:  0-1 drink a day for women.  0-2 drinks a day for men. ? Be aware of how much alcohol is in your drink. In the [place] one drink equals one 12 oz bottle of beer (355 mL), one 5 oz glass of wine (148 mL), or one 1 oz glass of hard liquor (44 mL). Medicines Your health care provider may prescribe medicine if lifestyle changes are not enough to get your blood pressure under control and if:  Your systolic blood pressure is 130 or higher.  Your diastolic blood pressure is 80 or higher. Take medicines only as told by your health care provider. Follow the directions carefully. Blood pressure medicines must be taken as told by your health care provider. The medicine does not work as well when you skip doses. Skipping doses also puts you at risk for problems. Monitoring Before you monitor your blood  pressure:  Do not smoke, drink caffeinated beverages, or exercise within 30 minutes before taking a measurement.  Use the bathroom and empty your bladder (urinate).  Sit quietly for at least 5 minutes  before taking measurements. Monitor your blood pressure at home as told by your health care provider. To do this:  Sit with your back straight and supported.  Place your feet flat on the floor. Do not cross your legs.  Support your arm on a flat surface, such as a table. Make sure your upper arm is at heart level.  Each time you measure, take two or three readings one minute apart and record the results. You may also need to have your blood pressure checked regularly by your health care provider.   General information  Talk with your health care provider about your diet, exercise habits, and other lifestyle factors that may be contributing to hypertension.  Review all the medicines you take with your health care provider because there may be side effects or interactions.  Keep all visits as told by your health care provider. Your health care provider can help you create and adjust your plan for managing your high blood pressure. Where to find more information  National Heart, Lung, and Blood Institute: PopSteam.iswww.nhlbi.nih.gov  American Heart Association: www.heart.org Contact a health care provider if:  You think you are having a reaction to medicines you have taken.  You have repeated (recurrent) headaches.  You feel dizzy.  You have swelling in your ankles.  You have trouble with your vision. Get help right away if:  You develop a severe headache or confusion.  You have unusual weakness or numbness, or you feel faint.  You have severe pain in your chest or abdomen.  You vomit repeatedly.  You have trouble breathing. These symptoms may represent a serious problem that is an emergency. Do not wait to see if the symptoms will go away. Get medical help right away. Call your  local emergency services (911 in the [place] Do not drive yourself to the hospital. Summary  Hypertension is when the force of blood pumping through your arteries is too strong. If this condition is not controlled, it may put you at risk for serious complications.  Your personal target blood pressure may vary depending on your medical conditions, your age, and other factors. For most people, a normal blood pressure is less than 120/80.  Hypertension is managed by lifestyle changes, medicines, or both.  Lifestyle changes to help manage hypertension include losing weight, eating a healthy, low-sodium diet, exercising more, stopping smoking, and limiting alcohol. This information is not intended to replace advice given to you by your health care provider. Make sure you discuss any questions you have with your health care provider. Document Revised: 07/05/2019 Document Reviewed: 04/30/2019 Elsevier Patient Education  2021 [employer]

## 2020-09-16 NOTE — Progress Notes (Signed)
Subjective:    Patient ID: Erin Orr, female    DOB: 1961-05-23, 60 y.o.   MRN: [id number]  CC: Erin Orr is a 60 y.o. female who presents today for follow up.   HPI: She continues to focus on diet , eating healthier and eating smaller portions. She has lost almost 60lbs.     HISTORY:  Past Medical History:  Diagnosis Date  . Hypertension    History reviewed. No pertinent surgical history. Family History  Problem Relation Age of Onset  . Heart disease Brother        has 3 brothers  . Heart attack Brother   . Colon cancer Neg Hx   . Breast cancer Neg Hx     Allergies: Patient has no known allergies. Current Outpatient Medications on File Prior to Visit  Medication Sig Dispense Refill  . amLODipine (NORVASC) 10 MG tablet Take 1 tablet (10 mg total) by mouth at bedtime. 90 tablet 1   No current facility-administered medications on file prior to visit.    Social History   Tobacco Use  . Smoking status: Never Smoker  . Smokeless tobacco: Never Used  Vaping Use  . Vaping Use: Never used  Substance Use Topics  . Alcohol use: Never  . Drug use: Never    Review of Systems  Constitutional: Negative for chills and fever.  Respiratory: Negative for cough.   Cardiovascular: Negative for chest pain and palpitations.  Gastrointestinal: Negative for nausea and vomiting.      Objective:    BP 124/90   Pulse 98   Temp 99 F (37.2 C)   Ht 5' 5.98" (1.676 m)   Wt 166 lb 6.4 oz (75.5 kg)   SpO2 99%   BMI 26.87 kg/m  BP Readings from Last 3 Encounters:  09/16/20 124/90  08/05/20 (!) 141/80  06/03/20 132/90   Wt Readings from Last 3 Encounters:  09/16/20 166 lb 6.4 oz (75.5 kg)  08/05/20 176 lb (79.8 kg)  06/03/20 177 lb (80.3 kg)    Physical Exam Vitals reviewed.  Constitutional:      Appearance: She is well-developed.  Eyes:     Conjunctiva/sclera: Conjunctivae normal.  Cardiovascular:     Rate and Rhythm: Normal rate and  regular rhythm.     Pulses: Normal pulses.     Heart sounds: Normal heart sounds.  Pulmonary:     Effort: Pulmonary effort is normal.     Breath sounds: Normal breath sounds. No wheezing, rhonchi or rales.  Skin:    General: Skin is warm and dry.  Neurological:     Mental Status: She is alert.  Psychiatric:        Speech: Speech normal.        Behavior: Behavior normal.        Thought Content: Thought content normal.        Assessment & Plan:   Problem List Items Addressed This Visit      Cardiovascular and Mediastinum   Hypertension - Primary    DBP remains elevated. Increase losartan to 100mg . Continue amlodipine 10mg       Relevant Medications   losartan (COZAAR) 100 MG tablet       I have discontinued Onell Mcmath. Ashurst's losartan. I am also having her start on losartan. Additionally, I am having her maintain her amLODipine.   Meds ordered this encounter  Medications  . losartan (COZAAR) 100 MG tablet    Sig: Take 1 tablet (100 mg total)  by mouth daily.    Dispense:  90 tablet    Refill:  3    Order Specific Question:   Supervising Provider    Answer:   Sherlene Shams [[id number]]    Return precautions given.   Risks, benefits, and alternatives of the medications and treatment plan prescribed today were discussed, and patient expressed understanding.   Education regarding symptom management and diagnosis given to patient on AVS.  Continue to follow with Allegra Grana, FNP for routine health maintenance.   Audria Nine Suits and I agreed with plan.   Rennie Plowman, FNP

## 2020-09-16 NOTE — Assessment & Plan Note (Signed)
DBP remains elevated. Increase losartan to 100mg . Continue amlodipine 10mg 

## 2020-09-21 ENCOUNTER — Other Ambulatory Visit: Payer: Self-pay | Admitting: Family

## 2020-09-21 ENCOUNTER — Telehealth: Payer: Self-pay | Admitting: *Deleted

## 2020-09-21 DIAGNOSIS — R748 Abnormal levels of other serum enzymes: Secondary | ICD-10-CM

## 2020-09-21 DIAGNOSIS — I10 Essential (primary) hypertension: Secondary | ICD-10-CM

## 2020-09-21 NOTE — Telephone Encounter (Signed)
Please place future orders for lab appt.  

## 2020-09-22 ENCOUNTER — Telehealth: Payer: Self-pay | Admitting: Family

## 2020-09-22 NOTE — Telephone Encounter (Signed)
lft vm for pt to call ofc to sch [place]. Thanks 

## 2020-09-23 ENCOUNTER — Other Ambulatory Visit: Payer: Medicare HMO

## 2020-09-29 ENCOUNTER — Ambulatory Visit: Payer: Medicare HMO

## 2020-09-30 ENCOUNTER — Other Ambulatory Visit: Payer: Self-pay | Admitting: Family

## 2020-09-30 DIAGNOSIS — R748 Abnormal levels of other serum enzymes: Secondary | ICD-10-CM

## 2020-10-01 ENCOUNTER — Telehealth: Payer: Self-pay | Admitting: Family

## 2020-10-01 NOTE — Telephone Encounter (Signed)
lft vm for pt to call ofc [place] ab. thanks

## 2020-10-02 ENCOUNTER — Telehealth: Payer: Self-pay | Admitting: Family

## 2020-10-02 NOTE — Telephone Encounter (Signed)
lft vm for pt to call ofc to sch [place] thanks 

## 2020-10-06 ENCOUNTER — Telehealth: Payer: Self-pay | Admitting: Family

## 2020-10-06 NOTE — Telephone Encounter (Signed)
lft vm for pt to call ofc to sch [place] abdomen.thanks 

## 2020-10-20 ENCOUNTER — Telehealth: Payer: Self-pay | Admitting: Family

## 2020-10-20 NOTE — Telephone Encounter (Signed)
Left message for patient to call back and schedule Medicare Annual Wellness Visit (AWV) in office.   If not able to come in office, please offer to do virtually or by telephone.   Last AWV:09/26/2019   Please schedule at anytime with Nurse Health Advisor.   

## 2020-10-27 ENCOUNTER — Telehealth: Payer: Self-pay | Admitting: Family

## 2020-10-27 NOTE — Telephone Encounter (Signed)
lft vm for pt to call ofc to cancel [place] referral if not needed or to call [phone] option 3 and then 2 to sch. thanks

## 2020-11-06 ENCOUNTER — Telehealth: Payer: Self-pay | Admitting: Family

## 2020-11-06 NOTE — Telephone Encounter (Signed)
lft pt vm to call to sch pt was given the number.thanks

## 2020-11-12 ENCOUNTER — Telehealth: Payer: Self-pay | Admitting: Family

## 2020-11-12 NOTE — Telephone Encounter (Signed)
Left message for patient to call back and schedule Medicare Annual Wellness Visit (AWV) in office.   If not able to come in office, please offer to do virtually or by telephone.   Last AWV:09/26/2019   Please schedule at anytime with Nurse Health Advisor.

## 2020-11-16 ENCOUNTER — Ambulatory Visit: Payer: Medicare HMO | Admitting: Family

## 2020-11-16 DIAGNOSIS — Z0289 Encounter for other administrative examinations: Secondary | ICD-10-CM

## 2020-12-07 ENCOUNTER — Other Ambulatory Visit: Payer: Self-pay

## 2020-12-07 ENCOUNTER — Emergency Department
Admission: EM | Admit: 2020-12-07 | Discharge: 2020-12-08 | Disposition: A | Discharge status: Other Acute Inpatient Hospital | Payer: Medicare HMO | Attending: Emergency Medicine | Admitting: Emergency Medicine

## 2020-12-07 ENCOUNTER — Telehealth: Payer: Self-pay | Admitting: Family

## 2020-12-07 ENCOUNTER — Encounter: Payer: Self-pay | Admitting: Emergency Medicine

## 2020-12-07 DIAGNOSIS — F22 Delusional disorders: Secondary | ICD-10-CM | POA: Diagnosis present

## 2020-12-07 DIAGNOSIS — Z79899 Other long term (current) drug therapy: Secondary | ICD-10-CM | POA: Insufficient documentation

## 2020-12-07 DIAGNOSIS — Z20822 Contact with and (suspected) exposure to covid-19: Secondary | ICD-10-CM | POA: Diagnosis not present

## 2020-12-07 DIAGNOSIS — I10 Essential (primary) hypertension: Secondary | ICD-10-CM | POA: Diagnosis not present

## 2020-12-07 DIAGNOSIS — Y900 Blood alcohol level of less than 20 mg/100 ml: Secondary | ICD-10-CM | POA: Diagnosis not present

## 2020-12-07 DIAGNOSIS — F69 Unspecified disorder of adult personality and behavior: Secondary | ICD-10-CM | POA: Diagnosis not present

## 2020-12-07 DIAGNOSIS — Z046 Encounter for general psychiatric examination, requested by authority: Secondary | ICD-10-CM | POA: Insufficient documentation

## 2020-12-07 DIAGNOSIS — F600 Paranoid personality disorder: Secondary | ICD-10-CM | POA: Insufficient documentation

## 2020-12-07 LAB — COMPREHENSIVE METABOLIC PANEL
ALT: 11 U/L (ref 0–44)
AST: 19 U/L (ref 15–41)
Albumin: 4.2 g/dL (ref 3.5–5.0)
Alkaline Phosphatase: 97 U/L (ref 38–126)
Anion gap: 8 (ref 5–15)
BUN: 8 mg/dL (ref 6–20)
CO2: 27 mmol/L (ref 22–32)
Calcium: 11.2 mg/dL — ABNORMAL HIGH (ref 8.9–10.3)
Chloride: 103 mmol/L (ref 98–111)
Creatinine, Ser: 0.79 mg/dL (ref 0.44–1.00)
GFR, Estimated: >60 mL/min (ref >60)
Glucose, Bld: 101 mg/dL — ABNORMAL HIGH (ref 70–99)
Potassium: 3.5 mmol/L (ref 3.5–5.1)
Sodium: 138 mmol/L (ref 135–145)
Total Bilirubin: 1 mg/dL (ref 0.3–1.2)
Total Protein: 8.1 g/dL (ref 6.5–8.1)

## 2020-12-07 LAB — RESP PANEL BY RT-PCR (FLU A&B, COVID) ARPGX2
Influenza A by PCR: NEGATIVE
Influenza B by PCR: NEGATIVE
SARS Coronavirus 2 by RT PCR: NEGATIVE

## 2020-12-07 LAB — ETHANOL: Alcohol, Ethyl (B): <10 mg/dL (ref <10)

## 2020-12-07 LAB — CBC
HCT: 42.7 % (ref 36.0–46.0)
Hemoglobin: 14 g/dL (ref 12.0–15.0)
MCH: 28.6 pg (ref 26.0–34.0)
MCHC: 32.8 g/dL (ref 30.0–36.0)
MCV: 87.1 fL (ref 80.0–100.0)
Platelets: 233 10*3/uL (ref 150–400)
RBC: 4.9 MIL/uL (ref 3.87–5.11)
RDW: 13 % (ref 11.5–15.5)
WBC: 4.5 10*3/uL (ref 4.0–10.5)
nRBC: 0 % (ref 0.0–0.2)

## 2020-12-07 LAB — SALICYLATE LEVEL: Salicylate Lvl: <7 mg/dL — ABNORMAL LOW (ref 7.0–30.0)

## 2020-12-07 LAB — ACETAMINOPHEN LEVEL: Acetaminophen (Tylenol), Serum: <10 ug/mL — ABNORMAL LOW (ref 10–30)

## 2020-12-07 NOTE — ED Provider Notes (Signed)
[facility] Emergency Department Provider Note ____________________________________________   Event Date/Time   First MD Initiated Contact with Patient 12/07/20 1626     (approximate)  I have reviewed the triage vital signs and the nursing notes.  HISTORY  Chief Complaint Mental Health Problem   HPI Erin Orr is a 60 y.o. femalewho presents to the ED for evaluation of her mental health.  Chart review indicates history of hypertension.  Patient presents to the ED under IVC due to paranoid and erratic behavior at home.  Per her husband in the paperwork, she has been talking to her computer, displaying delusional behavior at home, not eating food or taking medications.  Multiple random trips to different states without telling anyone.  She tells me that she is actually divorced and just letting her ex-husband rent from her at home.  She reports that she is healthy and fine, takes no medications and has no medical history.  Looks like she was hospitalized in 2021 for delusional disorder.  She reports no recent illnesses, denies any pain right now.  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 No pertinent surgical history.  Prior to Admission medications   Medication Sig Start Date End Date Taking? Authorizing Provider  amLODipine (NORVASC) 10 MG tablet Take 1 tablet (10 mg total) by mouth at bedtime. 01/15/20   Allegra Grana, FNP  losartan  (COZAAR) 100 MG tablet Take 1 tablet (100 mg total) by mouth daily. 09/16/20   Allegra Grana, FNP    Allergies Patient has no known allergies.  Family History  Problem Relation Age of Onset   Heart disease Brother        has 3 brothers   Heart attack Brother    Colon cancer Neg Hx    Breast cancer Neg Hx     Social History Social History   Tobacco Use   Smoking status: Never   Smokeless tobacco: Never  Vaping Use   Vaping Use: Never used  Substance Use Topics   Alcohol use: Never   Drug use: Never    Review of Systems  Constitutional: No fever/chills Eyes: No visual changes. ENT: No sore throat. Cardiovascular: Denies chest pain. Respiratory: Denies shortness of breath. Gastrointestinal: No abdominal pain.  No nausea, no vomiting.  No diarrhea.  No constipation. Genitourinary: Negative for dysuria. Musculoskeletal: Negative for back pain. Skin: Negative for rash. Neurological: Negative for headaches, focal weakness or numbness.  ____________________________________________   PHYSICAL EXAM:  VITAL SIGNS: Vitals:   12/07/20 1605  BP: (!) 156/98  Pulse: 69  Resp: 16  Temp: 98.3 F (36.8 C)  SpO2: 100%     Constitutional: Alert and oriented. Well appearing and in no acute distress.  Well-kept.  Somewhat flirtatious Eyes: Conjunctivae are normal. PERRL. EOMI. Head: Atraumatic. Nose: No congestion/rhinnorhea. Mouth/Throat: Mucous membranes are moist.  Oropharynx non-erythematous. Neck: No stridor. No cervical spine tenderness to palpation. Cardiovascular: Normal rate, regular rhythm. Grossly normal heart sounds.  Good peripheral  circulation. Respiratory: Normal respiratory effort.  No retractions. Lungs CTAB. Gastrointestinal: Soft , nondistended, nontender to palpation. No CVA tenderness. Musculoskeletal: No lower extremity tenderness nor edema.  No joint effusions. No signs of acute trauma. Neurologic:  Normal speech and language. No gross focal  neurologic deficits are appreciated. No gait instability noted. Skin:  Skin is warm, dry and intact. No rash noted. Psychiatric: Mood and affect are odd.  She seems paranoid.Marland Kitchen Speech and behavior are tangential and erratic.  ____________________________________________   LABS (all labs ordered are listed, but only abnormal results are displayed)  Labs Reviewed  COMPREHENSIVE METABOLIC PANEL - Abnormal; Notable for the following components:      Result Value   Glucose, Bld 101 (*)    Calcium 11.2 (*)    All other components within normal limits  SALICYLATE LEVEL - Abnormal; Notable for the following components:   Salicylate Lvl <7.0 (*)    All other components within normal limits  ACETAMINOPHEN LEVEL - Abnormal; Notable for the following components:   Acetaminophen (Tylenol), Serum <10 (*)    All other components within normal limits  RESP PANEL BY RT-PCR (FLU A&B, COVID) ARPGX2  ETHANOL  CBC  URINE DRUG SCREEN, QUALITATIVE ([facility] ONLY)    ____________________________________________   PROCEDURES and INTERVENTIONS  Procedure(s) performed (including Critical Care):  Procedures  Medications - No data to display  ____________________________________________   MDM / ED COURSE   60 year old woman presents to the ED under IVC due to paranoid and erratic behavior upon psychiatric evaluation and disposition.  No evidence of medical pathology to contribute to her symptoms or to preclude psychiatric evaluation and disposition.  Blood work is unrevealing.  No evidence of neurologic or vascular deficits, no signs of trauma.  We will maintain IVC and have psychiatry evaluate the patient.   Clinical Course as of 12/07/20 1831  Mon Dec 07, 2020  [id number] The patient has been placed in psychiatric observation due to the need to provide a safe environment for the patient while obtaining psychiatric consultation and evaluation, as well as ongoing medical and medication management to treat  the patient's condition.  The patient has been placed under full IVC at this time.   [DS]    Clinical Course User Index [DS] Delton Prairie, MD    ____________________________________________   FINAL CLINICAL IMPRESSION(S) / ED DIAGNOSES  Final diagnoses:  Paranoid behavior (HCC)  Behavior concern in adult     ED Discharge Orders     None        Kaleisha Bhargava Katrinka Blazing   Note:  This document was prepared using Dragon voice recognition software and may include unintentional dictation errors.    Delton Prairie, MD 12/07/20 [id number]

## 2020-12-07 NOTE — ED Notes (Signed)
Patient belongings Black Addair sleeve wrap around top Black tank top Coral sports bra Brown belt [employer] Purple panties Pair black shoes White metal chain (placed in pocketbook) White metal pendant with clear stones   (placed in pocketbook) Yellow metal hoop earrings Black pocketbook --  Bobby pin.

## 2020-12-07 NOTE — BH Assessment (Signed)
Comprehensive Clinical Assessment (CCA) Note  12/07/2020 Erin Orr [id number]  Erin Orr, 60 year old female who presents to [facility] ED involuntarily for treatment. Per triage note, Arrives to ED under IVC commitment papers.  Per paperwork, patient has communicated threats to ex-husband.  Patient denies SI/ HI.   During TTS assessment pt presents alert and oriented x 4, restless but cooperative, and mood-congruent with affect. The pt does not appear to be responding to internal or external stimuli. Neither is the pt presenting with any delusional thinking. Pt verified the information provided to triage RN.   Pt identifies her main complaint to be that she is unsure why she was brought to the ED. Patient reports she is confused and her sequence of events is scattered. Patient reports her husband is taking a lot of medications and believes he may be suffering from dementia. Patient states the two are going through a divorce and she no longer feels obligated to "check-in" or keep him updated with her whereabouts. Patient reports the two still live together; however, she is letting him rent out a room until the divorce is finalized in January 2023. Patient denies the use of any illicit substances and alcohol. Pt reports no current INPT or OPT hx. Besides a recent road trip to [place] which landed her in the hospital due to dehydration, patient states she has no medical hx and does not take any medications. "I feel great! I am in the best of health." When asked about the safety of traveling alone to an unknown state, patient did not think it was unusual. Patient endorses disorganized thought and poor judgment. Pt denies current SI/HI/AH/VH.    Per Dr. Toni Amend pt is recommended for inpatient psychiatric admission.    Chief Complaint:  Chief Complaint  Patient presents with   Mental Health Problem   Visit Diagnosis: Delusional disorder    CCA Screening, Triage and Referral  (STR)  Patient Reported Information How did you hear about [place]? -- (IVC)  Referral name: No data recorded Referral phone number: No data recorded  Whom do you see for routine medical problems? No data recorded Practice/Facility Name: No data recorded Practice/Facility Phone Number: No data recorded Name of Contact: No data recorded Contact Number: No data recorded Contact Fax Number: No data recorded Prescriber Name: No data recorded Prescriber Address (if known): No data recorded  What Is the Reason for Your Visit/Call Today? Patient was brought to ED due to worsening mental health symptoms.  How [place]man Has This Been Causing You Problems? 1 wk - 1 month  What Do You Feel Would Help You the Most Today? -- (Assessment only)   Have You Recently Been in Any Inpatient Treatment (Hospital/Detox/Crisis Center/28-Day Program)? No data recorded Name/Location of Program/Hospital:No data recorded How [place] Were You There? No data recorded When Were You Discharged? No data recorded  Have You Ever Received Services From [facility] Before? No data recorded Who Do You See at [facility]? No data recorded  Have You Recently Had Any Thoughts About Hurting Yourself? No  Are You Planning to Commit Suicide/Harm Yourself At This time? No   Have you Recently Had Thoughts About Hurting Someone Karolee Ohs? No  Explanation: No data recorded  Have You Used Any Alcohol or Drugs in the Past 24 Hours? No  How Coral Ago Did You Use Drugs or Alcohol? No data recorded What Did You Use and How Much? No data recorded  Do You Currently Have a Therapist/Psychiatrist? No  Name of Therapist/Psychiatrist: No data recorded  Have You Been Recently Discharged From Any Office Practice or Programs? No  Explanation of Discharge From Practice/Program: No data recorded    CCA Screening Triage Referral Assessment Type of Contact: Face-to-Face  Is this Initial or Reassessment? No data recorded Date Telepsych  consult ordered in [facility]:  No data recorded Time Telepsych consult ordered in [facility]:  No data recorded  Patient Reported Information Reviewed? No data recorded Patient Left Without Being Seen? No data recorded Reason for Not Completing Assessment: No data recorded  Collateral Involvement: None provided   Does Patient Have a Court Appointed Legal Guardian? No data recorded Name and Contact of Legal Guardian: No data recorded If Minor and Not Living with Parent(s), Who has Custody? n/a  Is CPS involved or ever been involved? Never  Is APS involved or ever been involved? Never   Patient Determined To Be At Risk for Harm To Self or Others Based on Review of Patient Reported Information or Presenting Complaint? No  Method: No data recorded Availability of Means: No data recorded Intent: No data recorded Notification Required: No data recorded Additional Information for Danger to Others Potential: No data recorded Additional Comments for Danger to Others Potential: No data recorded Are There Guns or Other Weapons in Your Home? No data recorded Types of Guns/Weapons: No data recorded Are These Weapons Safely Secured?                            No data recorded Who Could Verify You Are Able To Have These Secured: No data recorded Do You Have any Outstanding Charges, Pending Court Dates, Parole/Probation? No data recorded Contacted To Inform of Risk of Harm To Self or Others: No data recorded  Location of Assessment: [facility] ED   Does Patient Present under Involuntary Commitment? Yes  IVC Papers Initial File Date: 12/07/20   [place] of Residence: [place]   Patient Currently Receiving the Following Services: Medication Management   Determination of Need: Urgent (48 hours)   Options For Referral: Inpatient Hospitalization; Medication Management     CCA Biopsychosocial Intake/Chief Complaint:  No data recorded Current Symptoms/Problems: No data recorded  Patient Reported  Schizophrenia/Schizoaffective Diagnosis in Past: No   Strengths: No data recorded Preferences: No data recorded Abilities: No data recorded  Type of Services Patient Feels are Needed: No data recorded  Initial Clinical Notes/Concerns: No data recorded  Mental Health Symptoms Depression:   None   Duration of Depressive symptoms: No data recorded  Mania:   None   Anxiety:    None   Psychosis:   Grossly disorganized or catatonic behavior   Duration of Psychotic symptoms:  Less than six months   Trauma:   None   Obsessions:   None   Compulsions:   None   Inattention:   Disorganized   Hyperactivity/Impulsivity:   Feeling of restlessness   Oppositional/Defiant Behaviors:   None   Emotional Irregularity:   Potentially harmful impulsivity   Other Mood/Personality Symptoms:  No data recorded   Mental Status Exam Appearance and self-care  Stature:   Tall   Weight:   Thin   Clothing:   Casual   Grooming:   Normal   Cosmetic use:   None   Posture/gait:   Normal   Motor activity:   Not Remarkable   Sensorium  Attention:   Confused   Concentration:   Scattered   Orientation:   X5  Recall/memory:   Normal   Affect and Mood  Affect:   Anxious; Labile   Mood:   Anxious   Relating  Eye contact:   Normal   Facial expression:   Anxious   Attitude toward examiner:   Cooperative   Thought and Language  Speech flow:  Flight of Ideas   Thought content:   Appropriate to Mood and Circumstances   Preoccupation:   None   Hallucinations:   None   Organization:  No data recorded  Aff[employer] Computer ServicesExecutive Functions  Fund of Knowledge:   Average   Intelligence:   Average   Abstraction:   Abstract   Judgement:   Poor   Reality Testing:   Distorted   Insight:   Poor   Decision Making:   Impulsive   Social Functioning  Social Maturity:   Impulsive   Social Judgement:   Heedless   Stress  Stressors:   Transitions    Coping Ability:   Resilient   Skill Deficits:   Decision making   Supports:   Friends/Service system     Religion:    Leisure/Recreation:    Exercise/Diet: Exercise/Diet Do You Exercise?: No Do You Follow a Special Diet?: No Do You Have Any Trouble Sleeping?: No   CCA Employment/Education Employment/Work Situation: Employment / Work Situation Employment Situation: Unemployed Patient's Job has Been Impacted by Current Illness: No  Education: Education Is Patient Currently Attending School?: No   CCA Family/Childhood History Family and Relationship History: Family history Marital status: Married Number of Years Married: 28 What types of issues is patient dealing with in the relationship?: Patient reports infidelity. Does patient have children?: Yes How many children?: 2 How is patient's relationship with their children?: pt reports she has a good relationship with her children  Childhood History:     Child/Adolescent Assessment:     CCA Substance Use Alcohol/Drug Use: Alcohol / Drug Use Pain Medications: See PTA Prescriptions: See PTA Over the Counter: See PTA History of alcohol / drug use?: No history of alcohol / drug abuse                         ASAM's:  Six Dimensions of Multidimensional Assessment  Dimension 1:  Acute Intoxication and/or Withdrawal Potential:      Dimension 2:  Biomedical Conditions and Complications:      Dimension 3:  Emotional, Behavioral, or Cognitive Conditions and Complications:     Dimension 4:  Readiness to Change:     Dimension 5:  Relapse, Continued use, or Continued Problem Potential:     Dimension 6:  Recovery/Living Environment:     ASAM Severity Score:    ASAM Recommended Level of Treatment:     Substance use Disorder (SUD)    Recommendations for Services/Supports/Treatments:    DSM5 Diagnoses: Patient Active Problem List   Diagnosis Date Noted   Delusional disorder (HCC) 12/07/2020    Elevated alkaline phosphatase level 11/19/2019   Delusion (HCC) 10/26/2019   Acute psychosis (HCC) 10/26/2019   Psychosis (HCC)    Hot flashes 10/11/2019   Hypercalcemia 10/11/2019   Routine physical examination 08/05/2019   Hypertension 08/05/2019   Lumbar paraspinal muscle spasm 07/03/2014   Lumbar radiculitis 07/03/2014   Cervical paraspinal muscle spasm 03/31/2014    Patient Centered Plan: Patient is on the following Treatment Plan(s):     Referrals to Alternative Service(s): Referred to Alternative Service(s):   Place:   Date:   Time:  Referred to Alternative Service(s):   Place:   Date:   Time:    Referred to Alternative Service(s):   Place:   Date:   Time:    Referred to Alternative Service(s):   Place:   Date:   Time:     Zackariah Vanderpol Dierdre Searles, Counselor, LCAS-A

## 2020-12-07 NOTE — ED Notes (Signed)
IVC  FROM  [facility] 

## 2020-12-07 NOTE — ED Notes (Signed)
IVC  MOVED  TO  [facility] UNIT 

## 2020-12-07 NOTE — ED Triage Notes (Signed)
Arrives to ED under IVC commitment papers.  Per paperwork, patient has communicated threats to ex husband.  Patient denies SI/ HI.  Cooperative in Triage

## 2020-12-07 NOTE — Consult Note (Signed)
[facility] Face-to-Face Psychiatry Consult   Reason for Consult: Consult for 60 year old woman brought in under IVC through [facility] because of worsening psychotic symptoms Referring Physician: Katrinka Blazing Patient Identification: Erin Orr MRN:  [id number] Principal Diagnosis: Delusional disorder [facility]) Diagnosis:  Principal Problem:   Delusional disorder (HCC) Active Problems:   Hypertension   Total Time spent with patient: 1 hour  Subjective:   Erin Orr is a 60 y.o. female patient admitted with "I am doing fine, I think my husband has a problem".  HPI: Patient seen chart reviewed.  Reviewed commitment paperwork and also reviewed the notes from [facility].  Patient also known from previous encounters.  60 year old woman petitioned by her husband on the grounds of multiple worsening psychotic symptoms.  He describes the patient as talking to her computer, displaying delusions about their relationship at home, not eating normally and losing excessive weight, supposedly refusing food if it is not made in [place].  Recently took a spontaneous road trip to [place] and possibly other places wound up passed out from dehydration in the city needing family to rescue her.  Has refused all psychiatric treatment since her last hospitalization many months ago.  Patient on interview has a peculiar or odd affect with some lability and some flirtatiousness to it.  Somewhat disorganized thinking.  Admits to having gone to [place] and passed out from dehydration but sees nothing unusual about it.  Patient insists that she and her husband are divorced and she just allows him to live in the house for rent.  Husband and other family members state that this is not in fact true.  Patient claims to be on no prescription medicine and can have no medical problems when it is clearly documented that she is still prescribed medicine for hypertension which she is not taking.  Patient denies suicidal or homicidal  thoughts.  Past Psychiatric History: Patient has a history of recurrent episodes of this most specifically having had a hospitalization in 2021 for similar delusions.  At that time the patient only took oral medicine with coercion and appears to have immediately stopped medicine after being in the hospital.  Risk to Self:   Risk to Others:   Prior Inpatient Therapy:   Prior Outpatient Therapy:    Past Medical History:  Past Medical History:  Diagnosis Date   Hypertension    History reviewed. No pertinent surgical history. Family History:  Family History  Problem Relation Age of Onset   Heart disease Brother        has 3 brothers   Heart attack Brother    Colon cancer Neg Hx    Breast cancer Neg Hx    Family Psychiatric  History: Denies knowing of any Social History:  Social History   Substance and Sexual Activity  Alcohol Use Never     Social History   Substance and Sexual Activity  Drug Use Never    Social History   Socioeconomic History   Marital status: Legally Separated    Spouse name: Not on file   Number of children: Not on file   Years of education: Not on file   Highest education level: Not on file  Occupational History   Not on file  Tobacco Use   Smoking status: Never   Smokeless tobacco: Never  Vaping Use   Vaping Use: Never used  Substance and Sexual Activity   Alcohol use: Never   Drug use: Never   Sexual activity: Not on file  Other Topics Concern   Not on file  Social History Narrative   At home    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    married   Social Determinants of Health   Financial Resource Strain: Not on file  Food Insecurity: Not on file  Transportation Needs: Not on file  Physical Activity: Not on file  Stress: Not on file  Social Connections: Not on file   Additional Social History:    Allergies:  No Known Allergies  Labs:  Results for orders placed or performed during the hospital encounter of 12/07/20 (from the past 48 hour(s))   Comprehensive metabolic panel     Status: Abnormal   Collection Time: 12/07/20  3:59 PM  Result Value Ref Range   Sodium 138 135 - 145 mmol/L   Potassium 3.5 3.5 - 5.1 mmol/L   Chloride 103 98 - 111 mmol/L   CO2 27 22 - 32 mmol/L   Glucose, Bld 101 (H) 70 - 99 mg/dL    Comment: Glucose reference range applies only to samples taken after fasting for at least 8 hours.   BUN 8 6 - 20 mg/dL   Creatinine, Ser 3.47 0.44 - 1.00 mg/dL   Calcium 42.5 (H) 8.9 - 10.3 mg/dL   Total Protein 8.1 6.5 - 8.1 g/dL   Albumin 4.2 3.5 - 5.0 g/dL   AST 19 15 - 41 U/L   ALT 11 0 - 44 U/L   Alkaline Phosphatase 97 38 - 126 U/L   Total Bilirubin 1.0 0.3 - 1.2 mg/dL   GFR, Estimated >95 >63 mL/min    Comment: (NOTE) Calculated using the CKD-EPI Creatinine Equation (2021)    Anion gap 8 5 - 15    Comment: Performed at [facility], [address] [place], [place] [address]  cbc     Status: None   Collection Time: 12/07/20  3:59 PM  Result Value Ref Range   WBC 4.5 4.0 - 10.5 K/uL   RBC 4.90 3.87 - 5.11 MIL/uL   Hemoglobin 14.0 12.0 - 15.0 g/dL   HCT 33.2 95.1 - 88.4 %   MCV 87.1 80.0 - 100.0 fL   MCH 28.6 26.0 - 34.0 pg   MCHC 32.8 30.0 - 36.0 g/dL   RDW 16.6 06.3 - 01.6 %   Platelets 233 150 - 400 K/uL   nRBC 0.0 0.0 - 0.2 %    Comment: Performed at [facility], [address] [place], [place] [address]    No current facility-administered medications for this encounter.   Current Outpatient Medications  Medication Sig Dispense Refill   amLODipine (NORVASC) 10 MG tablet Take 1 tablet (10 mg total) by mouth at bedtime. 90 tablet 1   losartan (COZAAR) 100 MG tablet Take 1 tablet (100 mg total) by mouth daily. 90 tablet 3    Musculoskeletal: Strength & Muscle Tone: within normal limits Gait & Station: normal Patient leans: N/A            Psychiatric Specialty Exam:  Presentation  General Appearance:  No data recorded Eye Contact: No data  recorded Speech: No data recorded Speech Volume: No data recorded Handedness: No data recorded  Mood and Affect  Mood: No data recorded Affect: No data recorded  Thought Process  Thought Processes: No data recorded Descriptions of Associations:No data recorded Orientation:No data recorded Thought Content:No data recorded History of Schizophrenia/Schizoaffective disorder:No data recorded Duration of Psychotic Symptoms:No data recorded Hallucinations:No data recorded Ideas of Reference:No data recorded Suicidal Thoughts:No  data recorded Homicidal Thoughts:No data recorded  Sensorium  Memory: No data recorded Judgment: No data recorded Insight: No data recorded  Executive Functions  Concentration: No data recorded Attention Span: No data recorded Recall: No data recorded Fund of Knowledge: No data recorded Language: No data recorded  Psychomotor Activity  Psychomotor Activity: No data recorded  Assets  Assets: No data recorded  Sleep  Sleep: No data recorded  Physical Exam: Physical Exam Vitals and nursing note reviewed.  Constitutional:      Appearance: Normal appearance.  HENT:     Head: Normocephalic and atraumatic.     Mouth/Throat:     Pharynx: Oropharynx is clear.  Eyes:     Pupils: Pupils are equal, round, and reactive to light.  Cardiovascular:     Rate and Rhythm: Normal rate and regular rhythm.  Pulmonary:     Effort: Pulmonary effort is normal.     Breath sounds: Normal breath sounds.  Abdominal:     General: Abdomen is flat.     Palpations: Abdomen is soft.  Musculoskeletal:        General: Normal range of motion.  Skin:    General: Skin is warm and dry.  Neurological:     General: No focal deficit present.     Mental Status: She is alert. Mental status is at baseline.  Psychiatric:        Attention and Perception: She is inattentive.        Mood and Affect: Mood normal. Affect is inappropriate.        Speech: Speech is  tangential.        Behavior: Behavior is agitated. Behavior is not aggressive.        Thought Content: Thought content is paranoid and delusional.        Cognition and Memory: Memory is impaired.        Judgment: Judgment is inappropriate.   Review of Systems  Constitutional: Negative.   HENT: Negative.    Eyes: Negative.   Respiratory: Negative.    Cardiovascular: Negative.   Gastrointestinal: Negative.   Musculoskeletal: Negative.   Skin: Negative.   Neurological: Negative.   Psychiatric/Behavioral: Negative.    Blood pressure (!) 156/98, pulse 69, temperature 98.3 F (36.8 C), temperature source Oral, resp. rate 16, height 5\' 9"  (1.753 m), weight 75.5 kg, SpO2 100 %. Body mass index is 24.58 kg/m.  Treatment Plan Summary: Plan 60 year old woman with recurrent odd symptoms.  Differential diagnosis would include delusional disorder, bipolar disorder with manic episodes, late onset schizophrenia like illness.  Does not appear to be likely substance related.  No other known medical cause.  Patient is not aggressively acting out but does seem to be neglecting her self care and so we will uphold the petition and recommend admission to the psychiatric ward.  If we have beds available here we will pursue that otherwise she can be referred out to other psychiatric hospitals.  Disposition: Recommend psychiatric Inpatient admission when medically cleared.  66, MD 12/07/2020 5:01 PM

## 2020-12-07 NOTE — ED Notes (Signed)
Pt sitting in room in chair.

## 2020-12-07 NOTE — BH Assessment (Signed)
Adult MH  Referral information for Psychiatric Hospitalization faxed to:   [facility] ([phone]-or- [phone]),   [facility] ([phone]),   [facility] ([phone] -or- [phone]),   [facility] (-[phone] -or- [phone]) [phone]   [place] ([phone]---[phone]---[phone]),   [facility] ([phone] or [phone])   [facility] ([phone] or [phone]),   [facility] ([phone]). 

## 2020-12-07 NOTE — ED Notes (Signed)
Pt alert, sitting in chair in room quietly.

## 2020-12-07 NOTE — ED Notes (Signed)
IVC  SEEN  BY  DR  CLAPACS  PENDING  PLACEMENT 

## 2020-12-08 ENCOUNTER — Telehealth: Payer: Self-pay | Admitting: Family

## 2020-12-08 LAB — URINE DRUG SCREEN, QUALITATIVE (ARMC ONLY)
Amphetamines, Ur Screen: NOT DETECTED
Barbiturates, Ur Screen: NOT DETECTED
Benzodiazepine, Ur Scrn: NOT DETECTED
Cannabinoid 50 Ng, Ur ~~LOC~~: NOT DETECTED
Cocaine Metabolite,Ur ~~LOC~~: NOT DETECTED
MDMA (Ecstasy)Ur Screen: NOT DETECTED
Methadone Scn, Ur: NOT DETECTED
Opiate, Ur Screen: NOT DETECTED
Phencyclidine (PCP) Ur S: NOT DETECTED
Tricyclic, Ur Screen: NOT DETECTED

## 2020-12-08 NOTE — Telephone Encounter (Signed)
err

## 2020-12-08 NOTE — BH Assessment (Signed)
Writer faxed pt's labs to [facility].

## 2020-12-08 NOTE — ED Notes (Signed)
This RN attempted to call report at this time. Receiving RN currently passing medication, informed this RN to call back in 30 minutes.

## 2020-12-08 NOTE — BH Assessment (Signed)
Patient has been accepted to [facility].  Patient assigned to Geriatric acute unit Accepting physician is Dr. Elesa Massed.  Call report to [phone].  Representative was [employer].   ER Staff is aware of it:  Lynden Ang, ER Secretary  Dr. Elesa Massed, ER MD  Amy, Patient's Nurse     Patient is tentatively accepted contingent upon her urine drug screen results.

## 2020-12-08 NOTE — ED Notes (Signed)
[facility]  COUNTY  SHERIFF  DEPT  CALLED  FOR  TRANSPORT  TO  [place]  DUNES  INFORMED  ANNIE  RN

## 2020-12-08 NOTE — ED Notes (Signed)
Belongings bag 1/1 sent with patient  

## 2020-12-08 NOTE — Telephone Encounter (Signed)
Left message for patient to call back and schedule Medicare Annual Wellness Visit (AWV) in office.   If not able to come in office, please offer to do virtually or by telephone.   Last AWV:09/26/2019   Please schedule at anytime with Nurse Health Advisor.   

## 2020-12-10 ENCOUNTER — Telehealth: Payer: Self-pay | Admitting: Family

## 2020-12-10 NOTE — Telephone Encounter (Signed)
Left message for patient to call back and schedule Medicare Annual Wellness Visit (AWV) in office.   If not able to come in office, please offer to do virtually or by telephone.   Last AWV:09/26/2019   Please schedule at anytime with Nurse Health Advisor.   

## 2020-12-18 ENCOUNTER — Telehealth: Payer: Self-pay | Admitting: Family

## 2020-12-18 NOTE — Telephone Encounter (Signed)
Are you willing to call Monday or would you prefer I do?

## 2020-12-18 NOTE — Telephone Encounter (Signed)
Erin Orr, form Erin S.S., [phone]. She would like Erin Orr to call her. Ok to call on Monday.

## 2020-12-21 NOTE — Telephone Encounter (Signed)
Yes please call to see what information they need

## 2020-12-21 NOTE — Telephone Encounter (Signed)
Received note taken from Erin Orr from [name] from [facility]. I called back again, but she must be gone for the day. I advised that I would try her in the morning & that tomorrow was [name]'s half day.

## 2020-12-21 NOTE — Telephone Encounter (Signed)
LM for [name] to please call me back in regards to patient.

## 2020-12-23 NOTE — Telephone Encounter (Signed)
I spoke with Ms. Erin Orr patient's Child psychotherapist. There has been allegations of self-neglect made by her husband. You can see ED notes where she was seen 6/27. It appears too that she was accepted to [facility], but did not go. Social Worker said that she was recently found out of state in her car passed out & disoriented. Pt has been non-complaint with meds according to notes & not eating. I let Ms.Erin Orr know that she no showed her last appointment & has not been complaint with following up on labs. She also never saw psychiatry & was referred after psychotic episode 10/2019. I let her know too that we were not aware that patient was married & apparently she claims not to be, but in fact is. She also is not employed. Ms. Erin Orr wanted [place] to reach out to patient to try to get her scheduled to come in office. A letter needs to be composed to send to [facility] deeming her unsafe to drive.

## 2020-12-23 NOTE — Telephone Encounter (Signed)
I called and spoke with patient's son & asked if he could get int ouch with his mother to have her call us. He stated that she has be "kinda sick lately". He had not spoken to her in around a week, but he drives a truck. He said that when he speaks with her he would relay message.

## 2021-01-01 NOTE — Telephone Encounter (Signed)
I have left patient a message to call back & schedule an appointment with Claris Che. I have also mailed letter.

## 2021-01-01 NOTE — Telephone Encounter (Signed)
Call pt Sch appt Put in appts notes' discuss safety and driving'  If no response , please mail letter to make a f/u with me

## 2021-01-08 ENCOUNTER — Other Ambulatory Visit: Payer: Self-pay

## 2021-01-08 ENCOUNTER — Encounter: Payer: Self-pay | Admitting: Emergency Medicine

## 2021-01-08 ENCOUNTER — Emergency Department
Admission: EM | Admit: 2021-01-08 | Discharge: 2021-01-08 | Disposition: A | Discharge status: Home / Self Care | Payer: Medicare HMO | Attending: Emergency Medicine | Admitting: Emergency Medicine

## 2021-01-08 DIAGNOSIS — F22 Delusional disorders: Secondary | ICD-10-CM | POA: Diagnosis present

## 2021-01-08 DIAGNOSIS — Z20822 Contact with and (suspected) exposure to covid-19: Secondary | ICD-10-CM | POA: Diagnosis not present

## 2021-01-08 DIAGNOSIS — Z79899 Other long term (current) drug therapy: Secondary | ICD-10-CM | POA: Diagnosis not present

## 2021-01-08 DIAGNOSIS — I10 Essential (primary) hypertension: Secondary | ICD-10-CM | POA: Insufficient documentation

## 2021-01-08 HISTORY — DX: Delusional disorders: F22

## 2021-01-08 LAB — URINE DRUG SCREEN, QUALITATIVE (ARMC ONLY)
Amphetamines, Ur Screen: NOT DETECTED
Barbiturates, Ur Screen: NOT DETECTED
Benzodiazepine, Ur Scrn: NOT DETECTED
Cannabinoid 50 Ng, Ur ~~LOC~~: NOT DETECTED
Cocaine Metabolite,Ur ~~LOC~~: NOT DETECTED
MDMA (Ecstasy)Ur Screen: NOT DETECTED
Methadone Scn, Ur: NOT DETECTED
Opiate, Ur Screen: NOT DETECTED
Phencyclidine (PCP) Ur S: NOT DETECTED
Tricyclic, Ur Screen: NOT DETECTED

## 2021-01-08 LAB — SALICYLATE LEVEL: Salicylate Lvl: <7 mg/dL — ABNORMAL LOW (ref 7.0–30.0)

## 2021-01-08 LAB — CBC
HCT: 44.4 % (ref 36.0–46.0)
Hemoglobin: 14.3 g/dL (ref 12.0–15.0)
MCH: 28.9 pg (ref 26.0–34.0)
MCHC: 32.2 g/dL (ref 30.0–36.0)
MCV: 89.9 fL (ref 80.0–100.0)
Platelets: 298 10*3/uL (ref 150–400)
RBC: 4.94 MIL/uL (ref 3.87–5.11)
RDW: 13.3 % (ref 11.5–15.5)
WBC: 6.7 10*3/uL (ref 4.0–10.5)
nRBC: 0 % (ref 0.0–0.2)

## 2021-01-08 LAB — COMPREHENSIVE METABOLIC PANEL
ALT: 10 U/L (ref 0–44)
AST: 20 U/L (ref 15–41)
Albumin: 4.5 g/dL (ref 3.5–5.0)
Alkaline Phosphatase: 100 U/L (ref 38–126)
Anion gap: 7 (ref 5–15)
BUN: 10 mg/dL (ref 6–20)
CO2: 25 mmol/L (ref 22–32)
Calcium: 11.1 mg/dL — ABNORMAL HIGH (ref 8.9–10.3)
Chloride: 106 mmol/L (ref 98–111)
Creatinine, Ser: 0.86 mg/dL (ref 0.44–1.00)
GFR, Estimated: >60 mL/min (ref >60)
Glucose, Bld: 103 mg/dL — ABNORMAL HIGH (ref 70–99)
Potassium: 3.4 mmol/L — ABNORMAL LOW (ref 3.5–5.1)
Sodium: 138 mmol/L (ref 135–145)
Total Bilirubin: 0.8 mg/dL (ref 0.3–1.2)
Total Protein: 8.4 g/dL — ABNORMAL HIGH (ref 6.5–8.1)

## 2021-01-08 LAB — ETHANOL: Alcohol, Ethyl (B): <10 mg/dL (ref <10)

## 2021-01-08 LAB — ACETAMINOPHEN LEVEL: Acetaminophen (Tylenol), Serum: <10 ug/mL — ABNORMAL LOW (ref 10–30)

## 2021-01-08 LAB — RESP PANEL BY RT-PCR (FLU A&B, COVID) ARPGX2
Influenza A by PCR: NEGATIVE
Influenza B by PCR: NEGATIVE
SARS Coronavirus 2 by RT PCR: NEGATIVE

## 2021-01-08 NOTE — ED Notes (Signed)
Pt discharged with Child psychotherapist. Discussed discharge instructions/papers and follow up. Denies questions

## 2021-01-08 NOTE — ED Notes (Signed)
Pt alert and oriented.  States she is here because people are worried about her falling because she was in [place] and ended up in [place] dehydrated and had to go to the hospital.   Pt calm and cooperative.  Denies needs at this time

## 2021-01-08 NOTE — ED Notes (Addendum)
Pt under DSS custody. Contact [facility] Halsted social work [phone] Sharlyne Cai supervisor [phone]  Protective order issued 7/29

## 2021-01-08 NOTE — ED Provider Notes (Signed)
[facility] Emergency Department Provider Note   ____________________________________________   Event Date/Time   First MD Initiated Contact with Patient 01/08/21 1306     (approximate)  I have reviewed the triage vital signs and the nursing notes.   HISTORY  Chief Complaint No chief complaint on file.   HPI Erin Orr is a 60 y.o. female who comes here under DSS custody.  She has a protective order issued today.  Patient herself says she filed for divorce about a month ago and then drove to [employer] and looking around.  She was just driving and had not meant to go to [place] but turned up there.  She thinks she is a little dehydrated and want something to help calm her because she is thinking she might be a little bit anxious.  Reportedly patient is not divorced and has been wandering.  She does have a previous visit here in this hospital where she was delusional.  She had been wandering. Patient currently denies any pain nausea vomiting fever chills or other complaints.     Past Medical History:  Diagnosis Date   Delusional disorder [facility])    Hypertension     Patient Active Problem List   Diagnosis Date Noted   Delusional disorder (HCC) 12/07/2020   Elevated alkaline phosphatase level 11/19/2019   Delusion (HCC) 10/26/2019   Acute psychosis (HCC) 10/26/2019   Psychosis (HCC)    Hot flashes 10/11/2019   Hypercalcemia 10/11/2019   Routine physical examination 08/05/2019   Hypertension 08/05/2019   Lumbar paraspinal muscle spasm 07/03/2014   Lumbar radiculitis 07/03/2014   Cervical paraspinal muscle spasm 03/31/2014    History reviewed. No pertinent surgical history.  Prior to Admission medications   Not on File    Allergies Patient has no known allergies.  Family History  Problem Relation Age of Onset   Heart disease Brother        has 3 brothers   Heart attack Brother    Colon cancer Neg Hx    Breast  cancer Neg Hx     Social History Social History   Tobacco Use   Smoking status: Never   Smokeless tobacco: Never  Vaping Use   Vaping Use: Never used  Substance Use Topics   Alcohol use: Never   Drug use: Never    Review of Systems  Constitutional: No fever/chills Eyes: No visual changes. ENT: No sore throat. Cardiovascular: Denies chest pain. Respiratory: Denies shortness of breath. Gastrointestinal: No abdominal pain.  No nausea, no vomiting.  No diarrhea.  No constipation. Genitourinary: Negative for dysuria. Musculoskeletal: Negative for back pain. Skin: Negative for rash. Neurological: Negative for headaches, focal weakness  ____________________________________________   PHYSICAL EXAM:  VITAL SIGNS: ED Triage Vitals  Enc Vitals Group     BP 01/08/21 1210 (!) 152/90     Pulse Rate 01/08/21 1210 (!) 114     Resp 01/08/21 1210 16     Temp 01/08/21 1210 98.7 F (37.1 C)     Temp Source 01/08/21 1210 Oral     SpO2 01/08/21 1210 99 %     Weight 01/08/21 1211 149 lb (67.6 kg)     Height 01/08/21 1211 5\' 6"  (1.676 m)     Head Circumference --      Peak Flow --      Pain Score 01/08/21 1211 0     Pain Loc --      Pain Edu? --  Excl. in GC? --     Constitutional: Alert and oriented. Well appearing and in no acute distress. Eyes: Conjunctivae are normal. PERRL. EOMI. Head: Atraumatic. Nose: No congestion/rhinnorhea. Mouth/Throat: Mucous membranes are moist.  Oropharynx non-erythematous. Neck: No stridor.  Cardiovascular: Normal rate, regular rhythm. Grossly normal heart sounds.  Good peripheral circulation. Respiratory: Normal respiratory effort.  No retractions. Lungs CTAB. Gastrointestinal: Soft and nontender. No distention. No abdominal bruits. No CVA tenderness. Musculoskeletal: No lower extremity tenderness nor edema.   Neurologic:  Normal speech and language. No gross focal neurologic deficits are appreciated. No gait instability. Skin:  Skin is  warm, dry and intact. No rash noted.   ____________________________________________   LABS (all labs ordered are listed, but only abnormal results are displayed)  Labs Reviewed  COMPREHENSIVE METABOLIC PANEL - Abnormal; Notable for the following components:      Result Value   Potassium 3.4 (*)    Glucose, Bld 103 (*)    Calcium 11.1 (*)    Total Protein 8.4 (*)    All other components within normal limits  SALICYLATE LEVEL - Abnormal; Notable for the following components:   Salicylate Lvl <7.0 (*)    All other components within normal limits  ACETAMINOPHEN LEVEL - Abnormal; Notable for the following components:   Acetaminophen (Tylenol), Serum <10 (*)    All other components within normal limits  ETHANOL  CBC  URINE DRUG SCREEN, QUALITATIVE ([facility] ONLY)   ____________________________________________  EKG   ____________________________________________  RADIOLOGY Jill Poling, personally viewed and evaluated these images (plain radiographs) as part of my medical decision making, as well as reviewing the written report by the radiologist.  ED MD interpretation:   Official radiology report(s): No results found.  ____________________________________________   PROCEDURES  Procedure(s) performed (including Critical Care):  Procedures   ____________________________________________   INITIAL IMPRESSION / ASSESSMENT AND PLAN / ED COURSE      The patient has been placed in psychiatric observation due to the need to provide a safe environment for the patient while obtaining psychiatric consultation and evaluation, as well as ongoing medical and medication management to treat the patient's condition.  The patient has not been placed under full IVC at this time.           ____________________________________________   FINAL CLINICAL IMPRESSION(S) / ED DIAGNOSES  Final diagnoses:  Delusions [facility])     ED Discharge Orders     None        Note:   This document was prepared using Dragon voice recognition software and may include unintentional dictation errors.    Arnaldo Natal, MD 01/08/21 1350

## 2021-01-08 NOTE — ED Notes (Signed)
Per dss patient is non compliant with meds.  Has been following people and ended up in [place].

## 2021-01-08 NOTE — ED Triage Notes (Signed)
DSS brought patient for evaluation which patient has agreed to .  There have been some safety concerns as patient has been wandering.

## 2021-01-08 NOTE — ED Notes (Signed)
Pt gray earrings, gray necklace and black apple watch placed in belongings bag inside zipped part of purse.  Witnessed by [employer] NT

## 2021-01-08 NOTE — ED Notes (Signed)
Per Marisue Ivan RN, pt believes she is divorced, pt lives with husband at home, is confused and drove to [place], unsure where she was going

## 2021-01-08 NOTE — Consult Note (Signed)
[facility] Face-to-Face Psychiatry Consult   Reason for Consult:  psych evaluation Referring Physician:  EDP Patient Identification: Erin Orr MRN:  [id number] Principal Diagnosis: Delusional disorder [facility](HCC) Diagnosis:  Principal Problem:   Delusional disorder (HCC)   Total Time spent with patient: 1 hour  Subjective:   Erin Orr is a 60 y.o. female patient sent to the ED from [facility] with her DSS case worker for a psych eval as they were "full".  HPI:  [age] yo female presented from [facility] by her DSS case worker for a psych eval following the court order that took her license.  [facility] was "full" and directed here.  She is not a threat to herself or others as [name] as she is not driving as she likes to "sight see" and ends up in different areas.  She went to [place] 3 years ago and [place] over a month ago.  When she went to [place], she got dehydrated and family went to get her.  When [facility], [facility]  She was discharged about two weeks ago with a "clean bill of health."  Gavin PoundDeborah does have a fixed delusion that she is getting a divorce and her husband is renting a room in her house.  Her daughter is here for support and her only safety concerns are her driving which the courts took away today and her decrease in food intake.  She does not feel she eats enough.  No suicidal/homicidal ideations, hallucinations, mania, or substance abuse.  Her DSS worker was called, Saintclair HalstedRhesha Carr, and confirms her story that she needs establishment of care with an intake assessment.  TTS let her know to make an appointment for her follow up [facility] as they are the ones who will be managing her outpatient care.  Psychiatrically stable for discharge with follow [facility] for establishment of care the courts and family request.    Past Psychiatric History: depression, delusions  Risk to Self:  none Risk to Others:  none Prior Inpatient Therapy:  a few timees Prior  Outpatient Therapy:  [facility]  Past Medical History:  Past Medical History:  Diagnosis Date   Delusional disorder (HCC)    Hypertension    History reviewed. No pertinent surgical history. Family History:  Family History  Problem Relation Age of Onset   Heart disease Brother        has 3 brothers   Heart attack Brother    Colon cancer Neg Hx    Breast cancer Neg Hx    Family Psychiatric  History: none Social History:  Social History   Substance and Sexual Activity  Alcohol Use Never     Social History   Substance and Sexual Activity  Drug Use Never    Social History   Socioeconomic History   Marital status: Legally Separated    Spouse name: Not on file   Number of children: Not on file   Years of education: Not on file   Highest education level: Not on file  Occupational History   Not on file  Tobacco Use   Smoking status: Never   Smokeless tobacco: Never  Vaping Use   Vaping Use: Never used  Substance and Sexual Activity   Alcohol use: Never   Drug use: Never   Sexual activity: Not on file  Other Topics Concern   Not on file  Social History Narrative   At home    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  married   Social Determinants of Corporate investment banker Strain: Not on file  Food Insecurity: Not on file  Transportation Needs: Not on file  Physical Activity: Not on file  Stress: Not on file  Social Connections: Not on file   Additional Social History:    Allergies:  No Known Allergies  Labs:  Results for orders placed or performed during the hospital encounter of 01/08/21 (from the past 48 hour(s))  Comprehensive metabolic panel     Status: Abnormal   Collection Time: 01/08/21 12:21 PM  Result Value Ref Range   Sodium 138 135 - 145 mmol/L   Potassium 3.4 (L) 3.5 - 5.1 mmol/L   Chloride 106 98 - 111 mmol/L   CO2 25 22 - 32 mmol/L   Glucose, Bld 103 (H) 70 - 99 mg/dL    Comment: Glucose reference range applies only to samples taken after fasting for at  least 8 hours.   BUN 10 6 - 20 mg/dL   Creatinine, Ser 8.46 0.44 - 1.00 mg/dL   Calcium 96.2 (H) 8.9 - 10.3 mg/dL   Total Protein 8.4 (H) 6.5 - 8.1 g/dL   Albumin 4.5 3.5 - 5.0 g/dL   AST 20 15 - 41 U/L   ALT 10 0 - 44 U/L   Alkaline Phosphatase 100 38 - 126 U/L   Total Bilirubin 0.8 0.3 - 1.2 mg/dL   GFR, Estimated >95 >28 mL/min    Comment: (NOTE) Calculated using the CKD-EPI Creatinine Equation (2021)    Anion gap 7 5 - 15    Comment: Performed at [facility], [address] [place], [place] [address]  Ethanol     Status: None   Collection Time: 01/08/21 12:21 PM  Result Value Ref Range   Alcohol, Ethyl (B) <10 <10 mg/dL    Comment: (NOTE) Lowest detectable limit for serum alcohol is 10 mg/dL.  For medical purposes only. Performed at [facility], [address] [place], [place] [address]   Salicylate level     Status: Abnormal   Collection Time: 01/08/21 12:21 PM  Result Value Ref Range   Salicylate Lvl <7.0 (L) 7.0 - 30.0 mg/dL    Comment: Performed at [facility], [address] [place], [place] [address]  Acetaminophen level     Status: Abnormal   Collection Time: 01/08/21 12:21 PM  Result Value Ref Range   Acetaminophen (Tylenol), Serum <10 (L) 10 - 30 ug/mL    Comment: (NOTE) Therapeutic concentrations vary significantly. A range of 10-30 ug/mL  may be an effective concentration for many patients. However, some  are best treated at concentrations outside of this range. Acetaminophen concentrations >150 ug/mL at 4 hours after ingestion  and >50 ug/mL at 12 hours after ingestion are often associated with  toxic reactions.  Performed at [facility], [address] [place], [place] [address]   cbc     Status: None   Collection Time: 01/08/21 12:21 PM  Result Value Ref Range   WBC 6.7 4.0 - 10.5 K/uL   RBC 4.94 3.87 - 5.11 MIL/uL   Hemoglobin 14.3 12.0 - 15.0 g/dL   HCT 47.4 25.9 - 56.3 %   MCV 89.9 80.0 - 100.0  fL   MCH 28.9 26.0 - 34.0 pg   MCHC 32.2 30.0 - 36.0 g/dL   RDW 87.5 64.3 - 32.9 %   Platelets 298 150 - 400 K/uL   nRBC 0.0 0.0 - 0.2 %  Comment: Performed at [facility], [address] [place], [place] [address]  Urine Drug Screen, Qualitative     Status: None   Collection Time: 01/08/21 12:21 PM  Result Value Ref Range   Tricyclic, Ur Screen NONE DETECTED NONE DETECTED   Amphetamines, Ur Screen NONE DETECTED NONE DETECTED   MDMA (Ecstasy)Ur Screen NONE DETECTED NONE DETECTED   Cocaine Metabolite,Ur [place] NONE DETECTED NONE DETECTED   Opiate, Ur Screen NONE DETECTED NONE DETECTED   Phencyclidine (PCP) Ur S NONE DETECTED NONE DETECTED   Cannabinoid 50 Ng, Ur [place] NONE DETECTED NONE DETECTED   Barbiturates, Ur Screen NONE DETECTED NONE DETECTED   Benzodiazepine, Ur Scrn NONE DETECTED NONE DETECTED   Methadone Scn, Ur NONE DETECTED NONE DETECTED    Comment: (NOTE) Tricyclics + metabolites, urine    Cutoff 1000 ng/mL Amphetamines + metabolites, urine  Cutoff 1000 ng/mL MDMA (Ecstasy), urine              Cutoff 500 ng/mL Cocaine Metabolite, urine          Cutoff 300 ng/mL Opiate + metabolites, urine        Cutoff 300 ng/mL Phencyclidine (PCP), urine         Cutoff 25 ng/mL Cannabinoid, urine                 Cutoff 50 ng/mL Barbiturates + metabolites, urine  Cutoff 200 ng/mL Benzodiazepine, urine              Cutoff 200 ng/mL Methadone, urine                   Cutoff 300 ng/mL  The urine drug screen provides only a preliminary, unconfirmed analytical test result and should not be used for non-medical purposes. Clinical consideration and professional judgment should be applied to any positive drug screen result due to possible interfering substances. A more specific alternate chemical method must be used in order to obtain a confirmed analytical result. Gas chromatography / mass spectrometry (GC/MS) is the preferred confirm atory method. Performed at [facility], [address] [place], [place] [address]     No current facility-administered medications for this encounter.   No current outpatient medications on file.    Musculoskeletal: Strength & Muscle Tone: within normal limits Gait & Station: normal Patient leans: N/A  Psychiatric Specialty Exam: Physical Exam Vitals and nursing note reviewed.  Constitutional:      Appearance: Normal appearance.  HENT:     Head: Normocephalic.     Nose: Nose normal.  Pulmonary:     Effort: Pulmonary effort is normal.  Musculoskeletal:        General: Normal range of motion.  Neurological:     General: No focal deficit present.     Mental Status: She is alert and oriented to person, place, and time.  Psychiatric:        Attention and Perception: Attention and perception normal.        Mood and Affect: Mood and affect normal.        Speech: Speech normal.        Behavior: Behavior is cooperative.        Thought Content: Thought content is delusional.        Cognition and Memory: Cognition and memory normal.        Judgment: Judgment normal.    Review of Systems  All other systems reviewed and are negative.  Blood pressure (!) 152/90, pulse Marland Kitchen)  114, temperature 98.7 F (37.1 C), temperature source Oral, resp. rate 16, height 5\' 6"  (1.676 m), weight 67.6 kg, SpO2 99 %.Body mass index is 24.05 kg/m.  General Appearance: Casual  Eye Contact:  Good  Speech:  Normal Rate  Volume:  Normal  Mood:  Anxious  Affect:  Congruent  Thought Process:  Coherent and Descriptions of Associations: Intact  Orientation:  Full (Time, Place, and Person)  Thought Content:  Delusions  Suicidal Thoughts:  No  Homicidal Thoughts:  No  Memory:  Immediate;   Good Recent;   Good Remote;   Good  Judgement:  Fair  Insight:  Fair  Psychomotor Activity:  Normal  Concentration:  Concentration: Good and Attention Span: Good  Recall:  Good  Fund of Knowledge:  Good  Language:  Good  Akathisia:  No  Handed:   Right  AIMS (if indicated):     Assets:  Housing Leisure Time Physical Health Resilience Social Support  ADL's:  Intact  Cognition:  WNL  Sleep:        Physical Exam: Physical Exam Vitals and nursing note reviewed.  Constitutional:      Appearance: Normal appearance.  HENT:     Head: Normocephalic.     Nose: Nose normal.  Pulmonary:     Effort: Pulmonary effort is normal.  Musculoskeletal:        General: Normal range of motion.  Neurological:     General: No focal deficit present.     Mental Status: She is alert and oriented to person, place, and time.  Psychiatric:        Attention and Perception: Attention and perception normal.        Mood and Affect: Mood and affect normal.        Speech: Speech normal.        Behavior: Behavior is cooperative.        Thought Content: Thought content is delusional.        Cognition and Memory: Cognition and memory normal.        Judgment: Judgment normal.   Review of Systems  All other systems reviewed and are negative. Blood pressure (!) 152/90, pulse (!) 114, temperature 98.7 F (37.1 C), temperature source Oral, resp. rate 16, height 5\' 6"  (1.676 m), weight 67.6 kg, SpO2 99 %. Body mass index is 24.05 kg/m.  Treatment Plan Summary: Daily contact with patient to assess and evaluate symptoms and progress in treatment, Medication management, and Plan : Delusional disorder (fixed): Follow up with [facility] for establishment of care  Disposition: No evidence of imminent risk to self or others at present.   Patient does not meet criteria for psychiatric inpatient admission.  , NP 01/08/2021 2:22 PM

## 2021-01-08 NOTE — Discharge Instructions (Addendum)
Please follow-up with [facility] and return as needed.  [facility] can provide the care you need an evaluation knee.  It is different from what we can do here.

## 2021-01-08 NOTE — ED Notes (Signed)
Pt states not hungry or thirsty at this time. 

## 2021-01-08 NOTE — ED Notes (Signed)
Pt provided bag 1 of 1 belongings for d/c

## 2021-01-20 ENCOUNTER — Ambulatory Visit: Payer: Medicare HMO | Admitting: Family

## 2022-09-12 ENCOUNTER — Other Ambulatory Visit: Payer: Self-pay | Admitting: Family

## 2022-09-12 DIAGNOSIS — Z1231 Encounter for screening mammogram for malignant neoplasm of breast: Secondary | ICD-10-CM
